# Patient Record
Sex: Male | Born: 1995 | Race: White | Hispanic: No | Marital: Single | Smoking: Current some day smoker
Health system: Southern US, Community
[De-identification: ages and names within clinical notes are randomized; demographics above are authoritative.]

## PROBLEM LIST (undated history)

## (undated) DIAGNOSIS — D496 Neoplasm of unspecified behavior of brain: Secondary | ICD-10-CM

## (undated) DIAGNOSIS — F3181 Bipolar II disorder: Secondary | ICD-10-CM

## (undated) DIAGNOSIS — F4010 Social phobia, unspecified: Secondary | ICD-10-CM

## (undated) HISTORY — PX: BRAIN TUMOR EXCISION: SHX577

---
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
## 2021-06-03 ENCOUNTER — Encounter (HOSPITAL_BASED_OUTPATIENT_CLINIC_OR_DEPARTMENT_OTHER): Payer: Self-pay

## 2021-06-03 ENCOUNTER — Emergency Department (HOSPITAL_BASED_OUTPATIENT_CLINIC_OR_DEPARTMENT_OTHER): Payer: 59

## 2021-06-03 ENCOUNTER — Other Ambulatory Visit: Payer: Self-pay

## 2021-06-03 ENCOUNTER — Emergency Department (HOSPITAL_BASED_OUTPATIENT_CLINIC_OR_DEPARTMENT_OTHER)
Admission: EM | Admit: 2021-06-03 | Discharge: 2021-06-04 | Disposition: A | Discharge status: Home / Self Care | Payer: 59 | Attending: Emergency Medicine | Admitting: Emergency Medicine

## 2021-06-03 DIAGNOSIS — G928 Other toxic encephalopathy: Secondary | ICD-10-CM

## 2021-06-03 DIAGNOSIS — S20212A Contusion of left front wall of thorax, initial encounter: Secondary | ICD-10-CM | POA: Insufficient documentation

## 2021-06-03 DIAGNOSIS — S299XXA Unspecified injury of thorax, initial encounter: Secondary | ICD-10-CM | POA: Diagnosis present

## 2021-06-03 DIAGNOSIS — F3130 Bipolar disorder, current episode depressed, mild or moderate severity, unspecified: Secondary | ICD-10-CM | POA: Insufficient documentation

## 2021-06-03 DIAGNOSIS — F17200 Nicotine dependence, unspecified, uncomplicated: Secondary | ICD-10-CM | POA: Diagnosis not present

## 2021-06-03 DIAGNOSIS — G9349 Other encephalopathy: Secondary | ICD-10-CM | POA: Insufficient documentation

## 2021-06-03 DIAGNOSIS — R400 Somnolence: Secondary | ICD-10-CM | POA: Insufficient documentation

## 2021-06-03 DIAGNOSIS — Y900 Blood alcohol level of less than 20 mg/100 ml: Secondary | ICD-10-CM | POA: Diagnosis not present

## 2021-06-03 DIAGNOSIS — Y92410 Unspecified street and highway as the place of occurrence of the external cause: Secondary | ICD-10-CM | POA: Diagnosis not present

## 2021-06-03 LAB — CBC WITH DIFFERENTIAL/PLATELET
Abs Immature Granulocytes: 0.04 10*3/uL (ref 0.00–0.07)
Basophils Absolute: 0 10*3/uL (ref 0.0–0.1)
Basophils Relative: 0 %
Eosinophils Absolute: 0.2 10*3/uL (ref 0.0–0.5)
Eosinophils Relative: 2 %
HCT: 44.8 % (ref 39.0–52.0)
Hemoglobin: 14.4 g/dL (ref 13.0–17.0)
Immature Granulocytes: 0 %
Lymphocytes Relative: 22 %
Lymphs Abs: 2 10*3/uL (ref 0.7–4.0)
MCH: 27 pg (ref 26.0–34.0)
MCHC: 32.1 g/dL (ref 30.0–36.0)
MCV: 83.9 fL (ref 80.0–100.0)
Monocytes Absolute: 1 10*3/uL (ref 0.1–1.0)
Monocytes Relative: 10 %
Neutro Abs: 6.1 10*3/uL (ref 1.7–7.7)
Neutrophils Relative %: 66 %
Platelets: 303 10*3/uL (ref 150–400)
RBC: 5.34 MIL/uL (ref 4.22–5.81)
RDW: 15.9 % — ABNORMAL HIGH (ref 11.5–15.5)
WBC: 9.4 10*3/uL (ref 4.0–10.5)
nRBC: 0 % (ref 0.0–0.2)

## 2021-06-03 LAB — URINALYSIS, ROUTINE W REFLEX MICROSCOPIC
Bilirubin Urine: NEGATIVE
Glucose, UA: NEGATIVE mg/dL
Hgb urine dipstick: NEGATIVE
Ketones, ur: NEGATIVE mg/dL
Leukocytes,Ua: NEGATIVE
Nitrite: NEGATIVE
Protein, ur: NEGATIVE mg/dL
Specific Gravity, Urine: 1.013 (ref 1.005–1.030)
pH: 6.5 (ref 5.0–8.0)
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
## 2021-06-04 LAB — COMPREHENSIVE METABOLIC PANEL
ALT: 35 U/L (ref 0–44)
AST: 37 U/L (ref 15–41)
Albumin: 3.6 g/dL (ref 3.5–5.0)
Alkaline Phosphatase: 65 U/L (ref 38–126)
Anion gap: 7 (ref 5–15)
BUN: 15 mg/dL (ref 6–20)
CO2: 31 mmol/L (ref 22–32)
Calcium: 8.5 mg/dL — ABNORMAL LOW (ref 8.9–10.3)
Chloride: 103 mmol/L (ref 98–111)
Creatinine, Ser: 1.24 mg/dL (ref 0.61–1.24)
GFR, Estimated: >60 mL/min (ref >60)
Glucose, Bld: 77 mg/dL (ref 70–99)
Potassium: 3.6 mmol/L (ref 3.5–5.1)
Sodium: 141 mmol/L (ref 135–145)
Total Bilirubin: 0.2 mg/dL — ABNORMAL LOW (ref 0.3–1.2)
Total Protein: 5.9 g/dL — ABNORMAL LOW (ref 6.5–8.1)

## 2021-06-04 LAB — ETHANOL: Alcohol, Ethyl (B): <10 mg/dL (ref <10)

## 2021-06-04 LAB — AMMONIA: Ammonia: 53 umol/L — ABNORMAL HIGH (ref 9–35)

## 2021-06-04 LAB — RAPID URINE DRUG SCREEN, HOSP PERFORMED
Amphetamines: NOT DETECTED
Barbiturates: NOT DETECTED
Benzodiazepines: NOT DETECTED
Cocaine: NOT DETECTED
Opiates: NOT DETECTED
Tetrahydrocannabinol: NOT DETECTED

## 2021-06-04 LAB — PROTIME-INR
INR: 0.9 (ref 0.8–1.2)
Prothrombin Time: 12.1 seconds (ref 11.4–15.2)

## 2021-06-04 MED ORDER — LACTULOSE 20 GM/30ML PO SOLN: 20.0000 g | Freq: Two times a day (BID) | ORAL | 0 refills | Status: DC

## 2021-06-04 NOTE — Discharge Instructions (Addendum)
You were seen today for altered mental status.  This is multifactorial likely.  However, you did have some elevated ammonia levels.  Take lactulose twice daily with the goal to increase stools and rid your body of ammonia.  It is very importantly follow-up with a primary physician.  Additionally, you should reestablish psychiatric care and evaluation given your ongoing bipolar disorder.

## 2021-12-19 IMAGING — CT CT HEAD W/O CM
4 of 5 series · 17 of 47 positions shown, 19 images · non-contrast
Comparison: MR head 03/15/2008

CLINICAL DATA: Mental status change.  Unknown cause.

EXAM:
CT HEAD WITHOUT CONTRAST
TECHNIQUE: Contiguous axial images were obtained from the base of the skull
through the vertex without intravenous contrast.

[Series 2: head wo · axial · 0.42mm/px · z∈[-123,-13]mm · 7 of 30 slices shown, 9 images]
[im 4/30  brain]
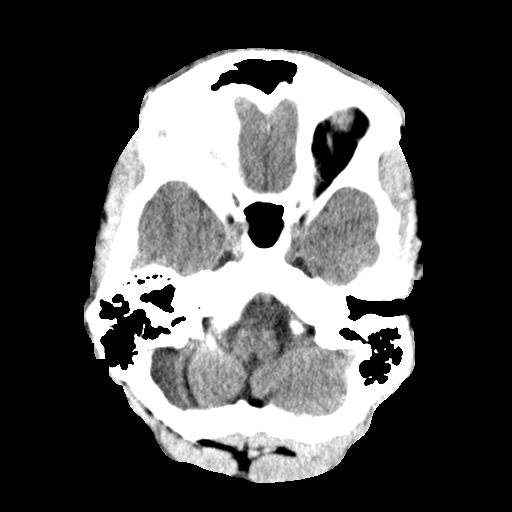
[im 4/30  bone]
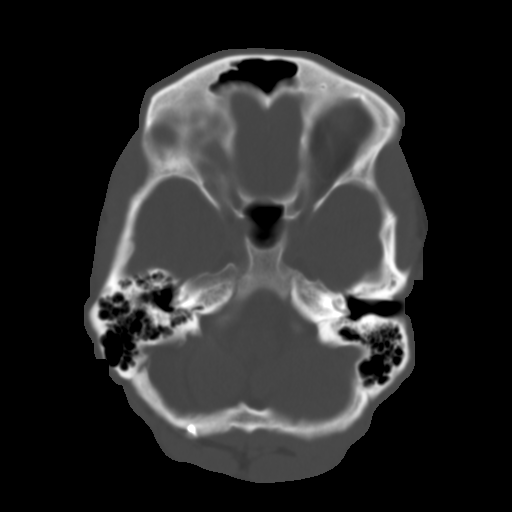
[im 8/30  brain]
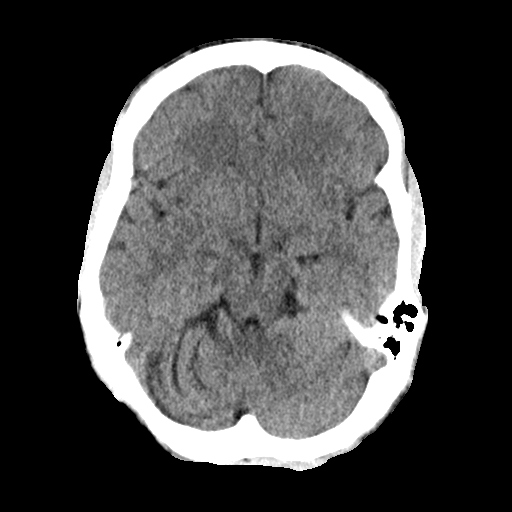
[im 11/30  brain]
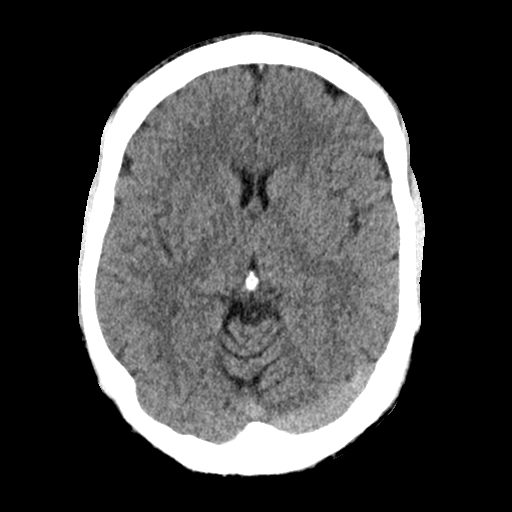
[im 15/30  brain]
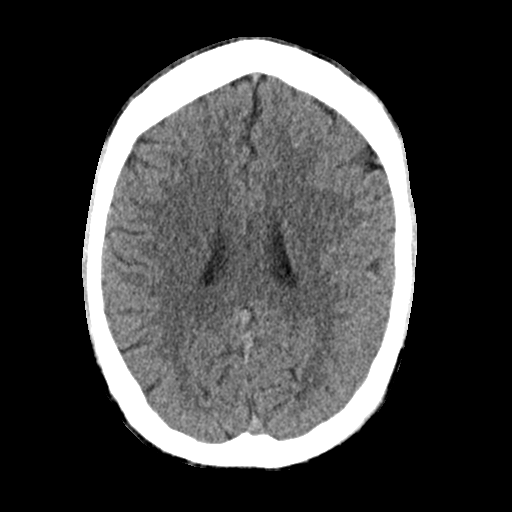
[im 19/30  brain]
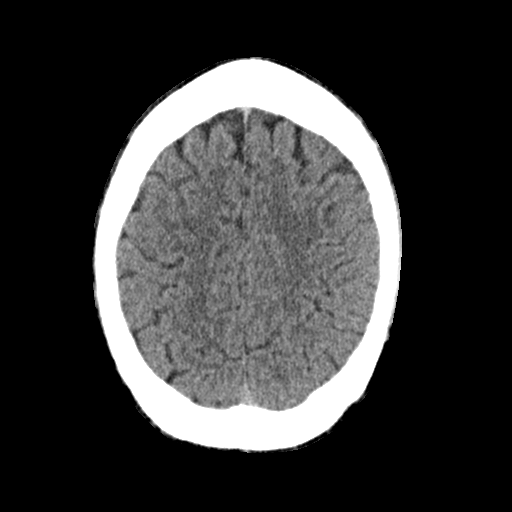
[im 19/30  bone]
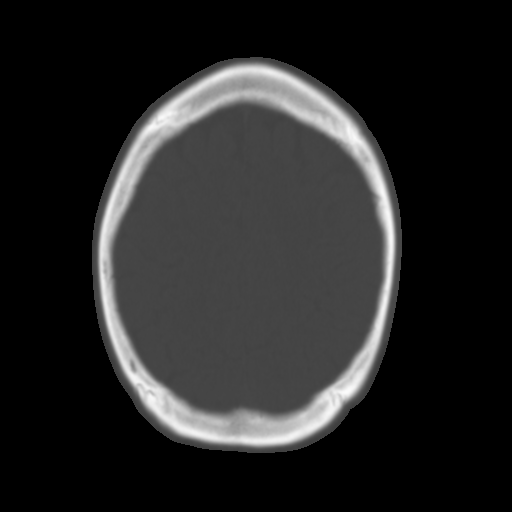
[im 22/30  brain]
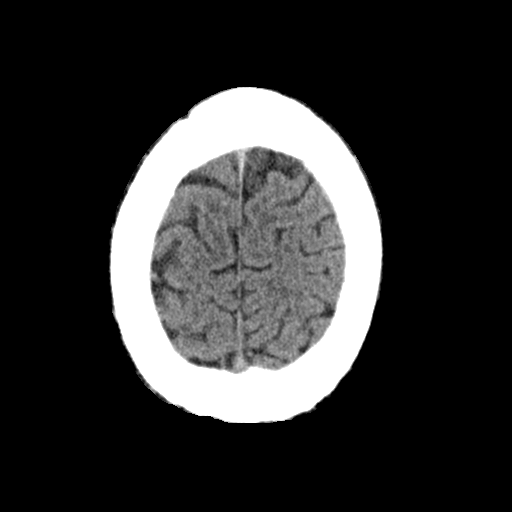
[im 26/30  brain]
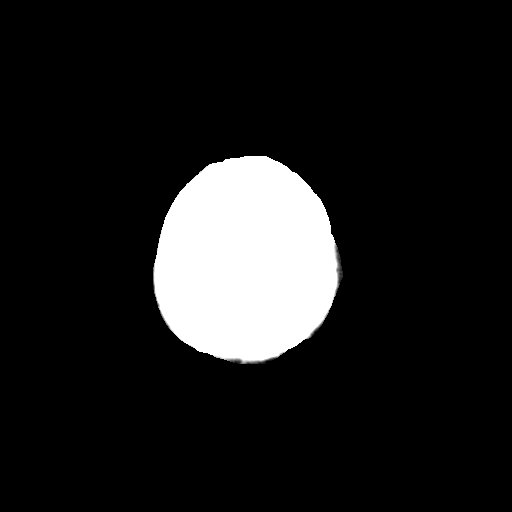

[Series 3: head bone · axial · 0.42mm/px · z∈[-124,-72]mm · 4 of 75 slices shown]
[im 8/75  bone]
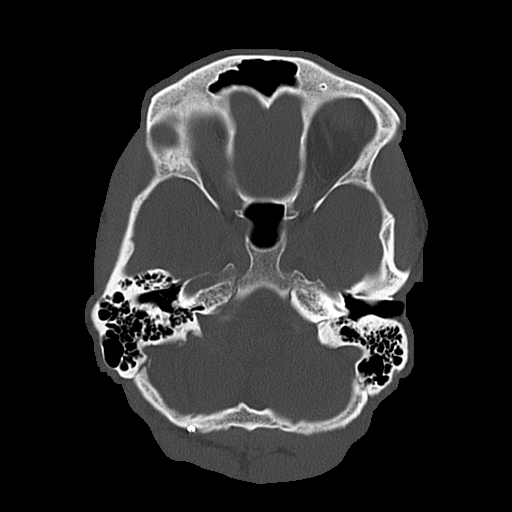
[im 15/75  bone]
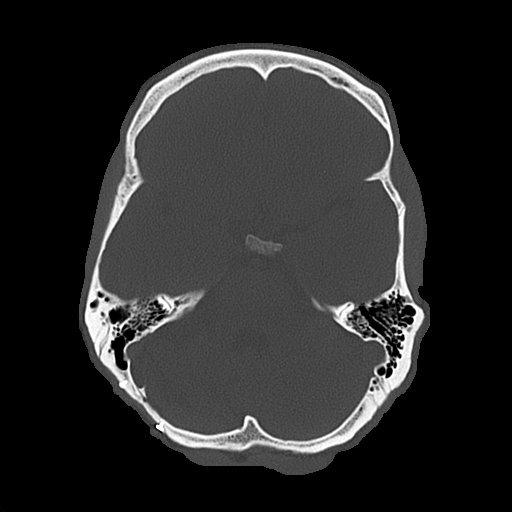
[im 23/75  bone]
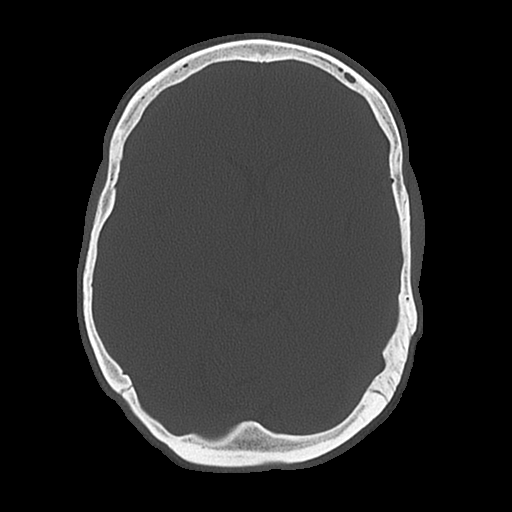
[im 34/75  bone]
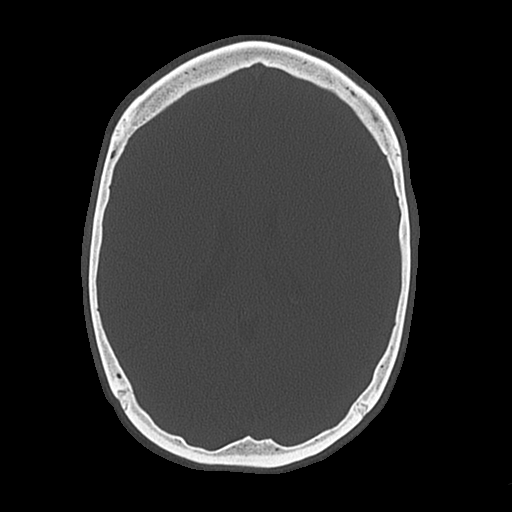

[Series 4: coronal soft · coronal · 0.33mm/px · 3 of 63 slices shown]
[im 16/63  brain]
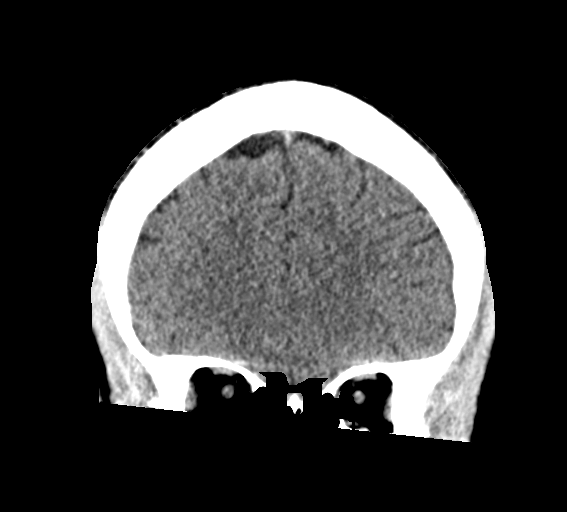
[im 32/63  brain]
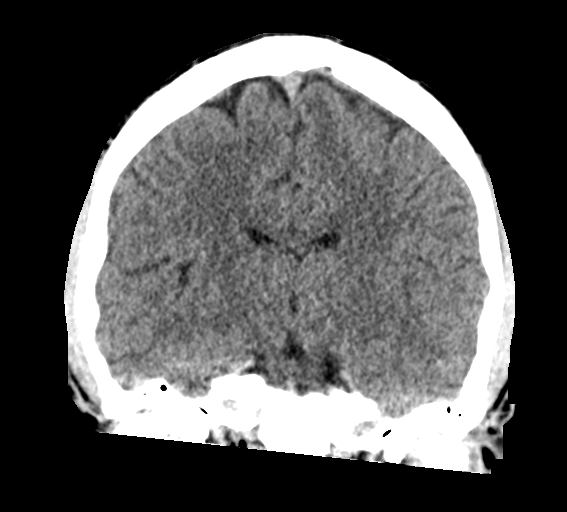
[im 47/63  brain]
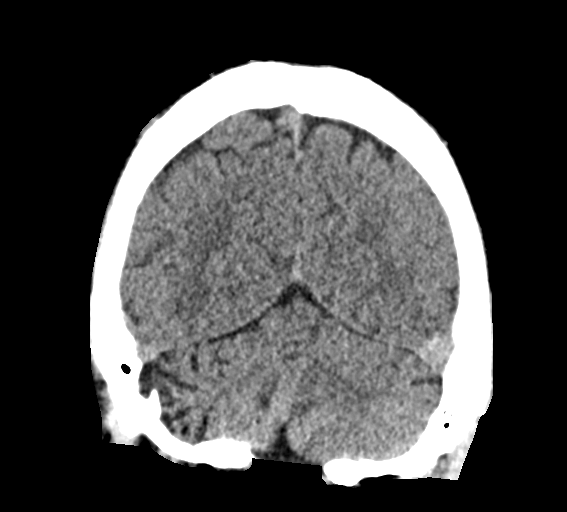

[Series 5: sagittal soft · sagittal · 0.33mm/px · 3 of 51 slices shown]
[im 17/51  brain]
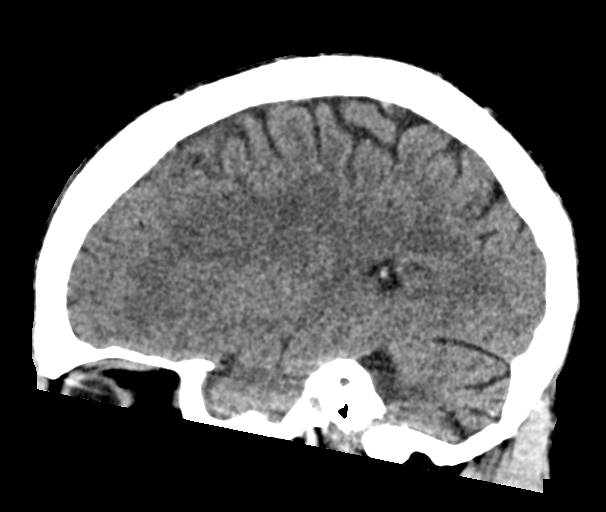
[im 26/51  brain]
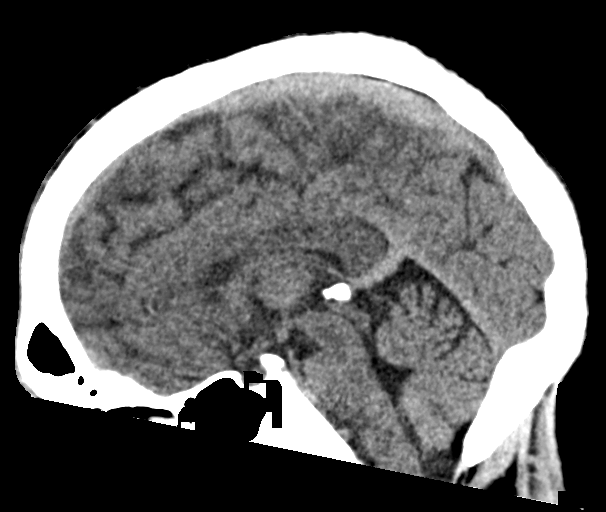
[im 34/51  brain]
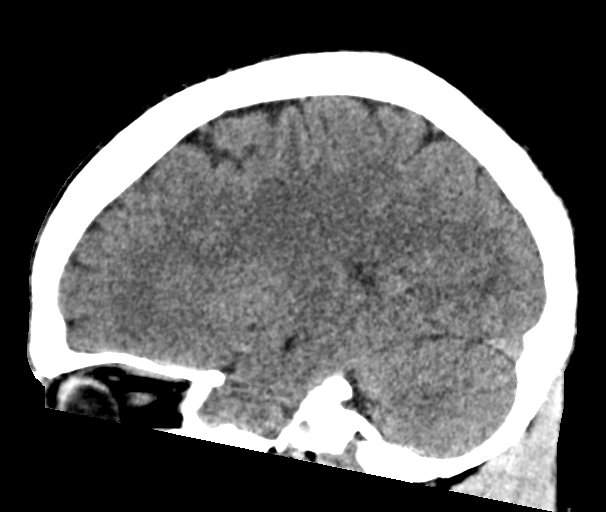

[17 of 47 positions shown; findings below may reference images not displayed]

FINDINGS: Brain:

Right cerebellar encephalomalacia consistent with history of postop
changes of a right cerebellar mass noted on MR head 03/15/2008. No
evidence of large-territorial acute infarction. No parenchymal
hemorrhage. No mass lesion. No extra-axial collection.

No mass effect or midline shift. No hydrocephalus. Basilar cisterns
are patent.

Vascular: No hyperdense vessel.

Skull: No acute fracture or focal lesion. Right occipital surgical
changes.

Sinuses/Orbits: Paranasal sinuses and mastoid air cells are clear.
The orbits are unremarkable.

Other: None.
IMPRESSION: No acute intracranial abnormality.

## 2023-01-27 DIAGNOSIS — C719 Malignant neoplasm of brain, unspecified: Secondary | ICD-10-CM | POA: Insufficient documentation

## 2023-04-30 ENCOUNTER — Emergency Department (HOSPITAL_COMMUNITY)
Admission: EM | Admit: 2023-04-30 | Discharge: 2023-04-30 | Discharge status: Against Medical Advice | Payer: Medicaid Other | Attending: Emergency Medicine | Admitting: Emergency Medicine

## 2023-04-30 ENCOUNTER — Encounter (HOSPITAL_COMMUNITY): Payer: Self-pay

## 2023-04-30 ENCOUNTER — Other Ambulatory Visit: Payer: Self-pay

## 2023-04-30 DIAGNOSIS — F1123 Opioid dependence with withdrawal: Secondary | ICD-10-CM | POA: Diagnosis present

## 2023-04-30 DIAGNOSIS — Z5321 Procedure and treatment not carried out due to patient leaving prior to being seen by health care provider: Secondary | ICD-10-CM | POA: Diagnosis not present

## 2023-04-30 NOTE — ED Triage Notes (Addendum)
Patient reports he takes opiates recreationally and has been without for a day. States he came here to get some more like morphine. Informed patient ER does not just give out narcotics for recreational use, states he would just take whatever we would give him.

## 2023-05-08 ENCOUNTER — Encounter (HOSPITAL_BASED_OUTPATIENT_CLINIC_OR_DEPARTMENT_OTHER): Payer: Self-pay | Admitting: Emergency Medicine

## 2023-05-08 ENCOUNTER — Emergency Department (HOSPITAL_BASED_OUTPATIENT_CLINIC_OR_DEPARTMENT_OTHER): Payer: Medicaid Other

## 2023-05-08 ENCOUNTER — Emergency Department (HOSPITAL_BASED_OUTPATIENT_CLINIC_OR_DEPARTMENT_OTHER)
Admission: EM | Admit: 2023-05-08 | Discharge: 2023-05-08 | Disposition: A | Discharge status: Home / Self Care | Payer: Medicaid Other | Attending: Emergency Medicine | Admitting: Emergency Medicine

## 2023-05-08 ENCOUNTER — Other Ambulatory Visit: Payer: Self-pay

## 2023-05-08 DIAGNOSIS — R2232 Localized swelling, mass and lump, left upper limb: Secondary | ICD-10-CM | POA: Diagnosis present

## 2023-05-08 DIAGNOSIS — I809 Phlebitis and thrombophlebitis of unspecified site: Secondary | ICD-10-CM | POA: Insufficient documentation

## 2023-05-08 LAB — COMPREHENSIVE METABOLIC PANEL
ALT: 27 U/L (ref 0–44)
AST: 22 U/L (ref 15–41)
Albumin: 4.4 g/dL (ref 3.5–5.0)
Alkaline Phosphatase: 120 U/L (ref 38–126)
Anion gap: 10 (ref 5–15)
BUN: 18 mg/dL (ref 6–20)
CO2: 28 mmol/L (ref 22–32)
Calcium: 9.3 mg/dL (ref 8.9–10.3)
Chloride: 104 mmol/L (ref 98–111)
Creatinine, Ser: 0.96 mg/dL (ref 0.61–1.24)
GFR, Estimated: >60 mL/min (ref >60)
Glucose, Bld: 102 mg/dL — ABNORMAL HIGH (ref 70–99)
Potassium: 3.7 mmol/L (ref 3.5–5.1)
Sodium: 142 mmol/L (ref 135–145)
Total Bilirubin: 0.5 mg/dL (ref 0.3–1.2)
Total Protein: 7.5 g/dL (ref 6.5–8.1)

## 2023-05-08 LAB — LACTIC ACID, PLASMA: Lactic Acid, Venous: 1.3 mmol/L (ref 0.5–1.9)

## 2023-05-08 LAB — CBC WITH DIFFERENTIAL/PLATELET
Abs Immature Granulocytes: 0.01 10*3/uL (ref 0.00–0.07)
Basophils Absolute: 0 10*3/uL (ref 0.0–0.1)
Basophils Relative: 0 %
Eosinophils Absolute: 0 10*3/uL (ref 0.0–0.5)
Eosinophils Relative: 0 %
HCT: 45.5 % (ref 39.0–52.0)
Hemoglobin: 15 g/dL (ref 13.0–17.0)
Immature Granulocytes: 0 %
Lymphocytes Relative: 27 %
Lymphs Abs: 1.6 10*3/uL (ref 0.7–4.0)
MCH: 25.5 pg — ABNORMAL LOW (ref 26.0–34.0)
MCHC: 33 g/dL (ref 30.0–36.0)
MCV: 77.2 fL — ABNORMAL LOW (ref 80.0–100.0)
Monocytes Absolute: 0.7 10*3/uL (ref 0.1–1.0)
Monocytes Relative: 11 %
Neutro Abs: 3.5 10*3/uL (ref 1.7–7.7)
Neutrophils Relative %: 62 %
Platelets: 267 10*3/uL (ref 150–400)
RBC: 5.89 MIL/uL — ABNORMAL HIGH (ref 4.22–5.81)
RDW: 16.8 % — ABNORMAL HIGH (ref 11.5–15.5)
WBC: 5.8 10*3/uL (ref 4.0–10.5)
nRBC: 0 % (ref 0.0–0.2)

## 2023-05-08 MED ORDER — SODIUM CHLORIDE 0.9 % IV SOLN
1.0000 g | Freq: Once | INTRAVENOUS | Status: AC
  Administered 2023-05-08: 1 g via INTRAVENOUS
  Filled 2023-05-08: qty 10

## 2023-05-08 MED ORDER — DOXYCYCLINE HYCLATE 100 MG PO TABS
100.0000 mg | ORAL_TABLET | Freq: Once | ORAL | Status: AC
  Administered 2023-05-08: 100 mg via ORAL
  Filled 2023-05-08: qty 1

## 2023-05-08 MED ORDER — IBUPROFEN 600 MG PO TABS: 600.0000 mg | ORAL_TABLET | Freq: Four times a day (QID) | ORAL | 0 refills | Status: DC | PRN

## 2023-05-08 MED ORDER — DOXYCYCLINE HYCLATE 100 MG PO CAPS: 100.0000 mg | ORAL_CAPSULE | Freq: Two times a day (BID) | ORAL | 0 refills | Status: DC

## 2023-05-08 MED ORDER — KETOROLAC TROMETHAMINE 15 MG/ML IJ SOLN
15.0000 mg | Freq: Once | INTRAMUSCULAR | Status: AC
  Administered 2023-05-08: 15 mg via INTRAVENOUS
  Filled 2023-05-08: qty 1

## 2023-05-08 NOTE — Discharge Instructions (Addendum)
Please make sure you follow-up with a primary care doctor, call the number below, if you do not have a primary care doctor.  You have a superficial blood clot in your arm, however it is not deep, thus you can use warm compresses, and ibuprofen to help with this.  If the area starts streaking, going up your arm, or there is worsening swelling, or you have shortness of breath please return to the ER.  I put you on a low-dose of antibiotic, given your IV drug use, take this and completion, to help prevent infection.

## 2023-05-08 NOTE — ED Provider Notes (Signed)
[place] EMERGENCY DEPARTMENT AT [facility] Provider Note   CSN: [id number] Arrival date & time: 05/08/23  1520     History  Chief Complaint  Patient presents with   Arm Pain    Brad Evans is a 27 y.o. male, history of IV drug use, who presents to the ED secondary to left arm swelling, pain that occurred after he shot up in his left arm today.  He states he shot up in his left arm, and withdrew the needle, and noticed that he had some pain in the area.  States that it became more swollen, and red streaking up his arm, but the redness has now improved.  This all occurred this morning.  He endorses feeling rundown, but denies any fevers.  Has not had any chest pain or shortness of breath.  Has not taken anything for the pain.  Needle did not break off for patient.  Home Medications Prior to Admission medications   Medication Sig Start Date End Date Taking? Authorizing Provider  doxycycline (VIBRAMYCIN) 100 MG capsule Take 1 capsule (100 mg total) by mouth 2 (two) times daily. 05/08/23  Yes Aina Rossbach L, PA  ibuprofen (ADVIL) 600 MG tablet Take 1 tablet (600 mg total) by mouth every 6 (six) hours as needed. 05/08/23  Yes Jakwon Gayton L, PA  Lactulose 20 GM/30ML SOLN Take 30 mLs (20 g total) by mouth 2 (two) times daily. 06/04/21   Horton, Mayer Masker, MD  sertraline (ZOLOFT) 50 MG tablet Take 50 mg by mouth every morning. 11/24/14   [provider]      Allergies    Patient has no known allergies.    Review of Systems   Review of Systems  Constitutional:  Negative for fever.  Skin:  Positive for color change.    Physical Exam Updated Vital Signs BP (!) 159/98   Pulse 98   Temp (!) 97.4 F (36.3 C) (Oral)   Resp 17   SpO2 100%  Physical Exam Vitals and nursing note reviewed.  Constitutional:      General: He is not in acute distress.    Appearance: He is well-developed.  HENT:     Head: Normocephalic and atraumatic.  Eyes:      Conjunctiva/sclera: Conjunctivae normal.  Cardiovascular:     Rate and Rhythm: Normal rate and regular rhythm.     Heart sounds: No murmur heard. Pulmonary:     Effort: Pulmonary effort is normal. No respiratory distress.     Breath sounds: Normal breath sounds.  Abdominal:     Palpations: Abdomen is soft.     Tenderness: There is no abdominal tenderness.  Musculoskeletal:        General: No swelling.     Cervical back: Neck supple.     Comments: TTP of left mid forearm, worse with flexion/extension, palpation  Skin:    General: Skin is warm and dry.     Capillary Refill: Capillary refill takes less than 2 seconds.     Comments: +erythematous nodule to anterior mid L forearm   Neurological:     Mental Status: He is alert.  Psychiatric:        Mood and Affect: Mood normal.     ED Results / Procedures / Treatments   Labs (all labs ordered are listed, but only abnormal results are displayed) Labs Reviewed  COMPREHENSIVE METABOLIC PANEL - Abnormal; Notable for the following components:      Result Value   Glucose, Bld  102 (*)    All other components within normal limits  CBC WITH DIFFERENTIAL/PLATELET - Abnormal; Notable for the following components:   RBC 5.89 (*)    MCV 77.2 (*)    MCH 25.5 (*)    RDW 16.8 (*)    All other components within normal limits  CULTURE, BLOOD (ROUTINE X 2)  CULTURE, BLOOD (ROUTINE X 2)  LACTIC ACID, PLASMA    EKG None  Radiology US Venous Img Upper Uni Left  Result Date: 05/08/2023 CLINICAL DATA:  Left forearm pain and swelling, history of IV drug use EXAM: LEFT UPPER EXTREMITY VENOUS DOPPLER ULTRASOUND TECHNIQUE: Gray-scale sonography with graded compression, as well as color Doppler and duplex ultrasound were performed to evaluate the upper extremity deep venous system from the level of the subclavian vein and including the jugular, axillary, basilic, radial, ulnar and upper cephalic vein. Spectral Doppler was utilized to evaluate flow at  rest and with distal augmentation maneuvers. COMPARISON:  None Available. FINDINGS: Contralateral Subclavian Vein: Respiratory phasicity is normal and symmetric with the symptomatic side. No evidence of thrombus. Normal compressibility. Internal Jugular Vein: No evidence of thrombus. Normal compressibility, respiratory phasicity and response to augmentation. Subclavian Vein: No evidence of thrombus. Normal compressibility, respiratory phasicity and response to augmentation. Axillary Vein: No evidence of thrombus. Normal compressibility, respiratory phasicity and response to augmentation. Cephalic Vein: No evidence of thrombus. Normal compressibility, respiratory phasicity and response to augmentation. Basilic Vein: No evidence of thrombus. Normal compressibility, respiratory phasicity and response to augmentation. Brachial Veins: No evidence of thrombus. Normal compressibility, respiratory phasicity and response to augmentation. Radial Veins: No evidence of thrombus. Normal compressibility, respiratory phasicity and response to augmentation. Ulnar Veins: No evidence of thrombus. Normal compressibility, respiratory phasicity and response to augmentation. Venous Reflux:  None visualized. Other Findings: Decreased compressibility is noted in the median cubital vein with echogenic material within consistent with nonocclusive thrombus. IMPRESSION: Nonocclusive thrombus is noted in the left median cubital vein. No other thrombus is seen. Electronically Signed   By: Alcide Clever M.D.   On: 05/08/2023 19:43   DG Humerus Left  Result Date: 05/08/2023 CLINICAL DATA:  Redness and inflammation of the left arm. IV drug use. EXAM: LEFT FOREARM - 2 VIEW; LEFT HUMERUS - 2+ VIEW COMPARISON:  None Available. FINDINGS: There is no evidence of fracture or other focal bone lesions in the left humerus or forearm. Soft tissues are unremarkable. IMPRESSION: Negative radiographs of the left humerus and forearm. Electronically Signed    By: Emmaline Kluver M.D.   On: 05/08/2023 17:17   DG Forearm Left  Result Date: 05/08/2023 CLINICAL DATA:  Redness and inflammation of the left arm. IV drug use. EXAM: LEFT FOREARM - 2 VIEW; LEFT HUMERUS - 2+ VIEW COMPARISON:  None Available. FINDINGS: There is no evidence of fracture or other focal bone lesions in the left humerus or forearm. Soft tissues are unremarkable. IMPRESSION: Negative radiographs of the left humerus and forearm. Electronically Signed   By: Emmaline Kluver M.D.   On: 05/08/2023 17:17    Procedures Procedures    Medications Ordered in ED Medications  ketorolac (TORADOL) 15 MG/ML injection 15 mg (15 mg Intravenous Given 05/08/23 1655)  cefTRIAXone (ROCEPHIN) 1 g in sodium chloride 0.9 % 100 mL IVPB (0 g Intravenous Stopped 05/08/23 1730)  doxycycline (VIBRA-TABS) tablet 100 mg (100 mg Oral Given 05/08/23 1654)    ED Course/ Medical Decision Making/ A&P  Medical Decision Making Patient is a 27 year old male, history of IV drug using, who presents to the ED secondary to redness, swelling to his left arm.  He states he shot up in this arm today, and then he developed a red nodule, and streaking up his arm.  The redness has improved however.  Denies any kind of fevers or chills.  We will obtain a ultrasound of his arm, to for further evaluation to evaluate for any chronic thrombus, as well as blood work, and lactic acid and blood cultures given his use of IV drugs wheezing.  He denies any chest pain or shortness of breath.  Amount and/or Complexity of Data Reviewed Labs: ordered.    Details: No acute findings on blood work Radiology: ordered.    Details: Humerus and radius ulna, unremarkable no findings of any kind of metallic objects or needles.  Nonocclusive thrombus in the left median cubital vein relatively. Discussion of management or test interpretation with external provider(s): Discussed with patient, he has a superficial  thrombophlebitis, blood.  Given his IVDU, we will start him on antibiotics for possible overlying cellulitis.  I discussed using ibuprofen, Tylenol, and warm compresses for the superficial thrombophlebitis, need for follow-up with PCP for further evaluation.  He voiced understanding discharged home.  Risk Prescription drug management.    Final Clinical Impression(s) / ED Diagnoses Final diagnoses:  Thrombophlebitis    Rx / DC Orders ED Discharge Orders          Ordered    doxycycline (VIBRAMYCIN) 100 MG capsule  2 times daily        05/08/23 2003    ibuprofen (ADVIL) 600 MG tablet  Every 6 hours PRN        05/08/23 2003              Pete Pelt, [place] 05/08/23 2305    Benjiman Core, MD 05/08/23 2328

## 2023-05-08 NOTE — ED Triage Notes (Signed)
Correction: pt thinks he hit the wrong spot when injecting.

## 2023-05-08 NOTE — ED Triage Notes (Signed)
Pt admits to being IV drug user and states his left arm from wrist to elbow was red and inflamed earlier, thinks he may have used bad needle. Chills. Feel run down

## 2023-05-13 LAB — CULTURE, BLOOD (ROUTINE X 2)
Culture: NO GROWTH
Special Requests: ADEQUATE

## 2023-06-28 ENCOUNTER — Other Ambulatory Visit: Payer: Self-pay

## 2023-06-28 ENCOUNTER — Encounter (HOSPITAL_BASED_OUTPATIENT_CLINIC_OR_DEPARTMENT_OTHER): Payer: Self-pay

## 2023-06-28 DIAGNOSIS — M79605 Pain in left leg: Secondary | ICD-10-CM | POA: Diagnosis not present

## 2023-06-28 DIAGNOSIS — M79643 Pain in unspecified hand: Secondary | ICD-10-CM | POA: Insufficient documentation

## 2023-06-28 DIAGNOSIS — M791 Myalgia, unspecified site: Secondary | ICD-10-CM | POA: Insufficient documentation

## 2023-06-28 DIAGNOSIS — M79604 Pain in right leg: Secondary | ICD-10-CM | POA: Diagnosis not present

## 2023-06-28 LAB — CBC
HCT: 42 % (ref 39.0–52.0)
Hemoglobin: 14.2 g/dL (ref 13.0–17.0)
MCH: 26.1 pg (ref 26.0–34.0)
MCHC: 33.8 g/dL (ref 30.0–36.0)
MCV: 77.2 fL — ABNORMAL LOW (ref 80.0–100.0)
Platelets: 341 10*3/uL (ref 150–400)
RBC: 5.44 MIL/uL (ref 4.22–5.81)
RDW: 14.3 % (ref 11.5–15.5)
WBC: 5.3 10*3/uL (ref 4.0–10.5)
nRBC: 0 % (ref 0.0–0.2)

## 2023-06-28 LAB — CK: Total CK: 333 U/L (ref 49–397)

## 2023-06-28 LAB — BASIC METABOLIC PANEL
Anion gap: 9 (ref 5–15)
BUN: 21 mg/dL — ABNORMAL HIGH (ref 6–20)
CO2: 28 mmol/L (ref 22–32)
Calcium: 9.8 mg/dL (ref 8.9–10.3)
Chloride: 106 mmol/L (ref 98–111)
Creatinine, Ser: 1.18 mg/dL (ref 0.61–1.24)
GFR, Estimated: >60 mL/min (ref >60)
Glucose, Bld: 93 mg/dL (ref 70–99)
Potassium: 3.9 mmol/L (ref 3.5–5.1)
Sodium: 143 mmol/L (ref 135–145)

## 2023-06-28 NOTE — ED Triage Notes (Signed)
Pt reports bilateral hand cramps since 1200 today with some generalized muscle cramps in legs. Pt working in [employer] and advised it been ongoing for the past couple of weeks.  GCS 15. A/ox4

## 2023-06-29 ENCOUNTER — Emergency Department (HOSPITAL_BASED_OUTPATIENT_CLINIC_OR_DEPARTMENT_OTHER)
Admission: EM | Admit: 2023-06-29 | Discharge: 2023-06-29 | Disposition: A | Discharge status: Home / Self Care | Payer: MEDICAID | Attending: Emergency Medicine | Admitting: Emergency Medicine

## 2023-06-29 DIAGNOSIS — R252 Cramp and spasm: Secondary | ICD-10-CM

## 2023-06-29 MED ORDER — IBUPROFEN 400 MG PO TABS
600.0000 mg | ORAL_TABLET | Freq: Once | ORAL | Status: AC
  Administered 2023-06-29: 600 mg via ORAL
  Filled 2023-06-29: qty 1

## 2023-06-29 NOTE — ED Provider Notes (Signed)
[facility] EMERG[facility]Y DE[facility]MENT AT [facility]  Provider Note  CSN: [id number] Arrival date & time: 06/28/23 2132  History Chief Complaint  Patient presents with   Hand Pain    Brad Evans is a 27 y.o. 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 He reports he has not been out in the heat much and has been trying to stay hydrated. He left work around 4pm today, cramping has improved since then. Some cramping in legs also but mostly it is in his hands. He denies any fever, N/V/D. He is 'worried about his health' but cannot specify any specific concern he has otherwise.    Home Medications Prior to Admission medications   Medication Sig Start Date End Date Taking? Authorizing Provider  doxycycline (VIBRAMYCIN) 100 MG capsule Take 1 capsule (100 mg total) by mouth 2 (two) times daily. 05/08/23   Small, Brooke L, PA  ibuprofen (ADVIL) 600 MG tablet Take 1 tablet (600 mg total) by mouth every 6 (six) hours as needed. 05/08/23   Small, Brooke L, PA  Lactulose 20 GM/30ML SOLN Take 30 mLs (20 g total) by mouth 2 (two) times daily. 06/04/21   Horton, Mayer Masker, MD  sertraline (ZOLOFT) 50 MG tablet Take 50 mg by mouth every morning. 11/24/14   [provider]     Allergies    Patient has no known allergies.   Review of Systems   Review of Systems Please see HPI for pertinent positives and negatives  Physical Exam BP (!) 155/89 (BP Location: Right Arm)   Pulse (!) 101   Temp 98.5 F (36.9 C) (Oral)   Resp 14   Ht 6' (1.829 m)   Wt 68 kg   SpO2 100%   BMI 20.34 kg/m   Physical Exam Vitals and nursing note reviewed.  Constitutional:      Appearance: Normal appearance.  HENT:     Head: Normocephalic and atraumatic.     Nose: Nose normal.     Mouth/Throat:     Mouth: Mucous membranes are moist.  Eyes:     Extraocular Movements: Extraocular movements intact.     Conjunctiva/sclera: Conjunctivae normal.   Cardiovascular:     Rate and Rhythm: Normal rate.     Pulses: Normal pulses.  Pulmonary:     Effort: Pulmonary effort is normal.     Breath sounds: Normal breath sounds.  Abdominal:     General: Abdomen is flat.     Palpations: Abdomen is soft.     Tenderness: There is no abdominal tenderness.  Musculoskeletal:        General: No swelling, tenderness or deformity. Normal range of motion.     Cervical back: Neck supple.  Skin:    General: Skin is warm and dry.  Neurological:     General: No focal deficit present.     Mental Status: He is alert.  Psychiatric:        Mood and Affect: Mood normal.     ED Results / Procedures / Treatments   EKG None  Procedures Procedures  Medications Ordered in the ED Medications  ibuprofen (ADVIL) tablet 600 mg (has no administration in time range)    Initial Impression and Plan  Patient here with nonspecific MSK pain and muscle cramping at work several hours ago. Exam is benign, vitals are reassuring. Labs done in triage show normal CBC, BMP and CK. Patient given Motrin for  pain. Reassured no apparently emergent cause of his cramping was found. Recommend he rest and stay cool for the next few days. PCP follow up, RTED for any other concerns.    ED Course       MDM Rules/Calculators/A&P Medical Decision Making Problems Addressed: Hand cramps: acute illness or injury  Amount and/or Complexity of Data Reviewed Labs: ordered. Decision-making details documented in ED Course.  Risk OTC drugs.     Final Clinical Impression(s) / ED Diagnoses Final diagnoses:  Hand cramps    Rx / DC Orders ED Discharge Orders     None        Pollyann Savoy, MD 06/29/23 0225

## 2023-09-13 ENCOUNTER — Emergency Department (HOSPITAL_BASED_OUTPATIENT_CLINIC_OR_DEPARTMENT_OTHER)
Admission: EM | Admit: 2023-09-13 | Discharge: 2023-09-13 | Disposition: A | Discharge status: Home / Self Care | Payer: MEDICAID | Attending: Emergency Medicine | Admitting: Emergency Medicine

## 2023-09-13 ENCOUNTER — Encounter (HOSPITAL_BASED_OUTPATIENT_CLINIC_OR_DEPARTMENT_OTHER): Payer: Self-pay

## 2023-09-13 ENCOUNTER — Emergency Department (HOSPITAL_BASED_OUTPATIENT_CLINIC_OR_DEPARTMENT_OTHER): Payer: MEDICAID

## 2023-09-13 ENCOUNTER — Other Ambulatory Visit: Payer: Self-pay

## 2023-09-13 DIAGNOSIS — Y92480 Sidewalk as the place of occurrence of the external cause: Secondary | ICD-10-CM | POA: Diagnosis not present

## 2023-09-13 DIAGNOSIS — W01198A Fall on same level from slipping, tripping and stumbling with subsequent striking against other object, initial encounter: Secondary | ICD-10-CM | POA: Diagnosis not present

## 2023-09-13 DIAGNOSIS — S066X1A Traumatic subarachnoid hemorrhage with loss of consciousness of 30 minutes or less, initial encounter: Secondary | ICD-10-CM | POA: Insufficient documentation

## 2023-09-13 DIAGNOSIS — S0101XA Laceration without foreign body of scalp, initial encounter: Secondary | ICD-10-CM | POA: Insufficient documentation

## 2023-09-13 MED ORDER — LIDOCAINE-EPINEPHRINE-TETRACAINE (LET) TOPICAL GEL
3.0000 mL | Freq: Once | TOPICAL | Status: AC
  Administered 2023-09-13: 3 mL via TOPICAL
  Filled 2023-09-13: qty 3

## 2023-09-13 MED ORDER — LIDOCAINE-EPINEPHRINE (PF) 2 %-1:200000 IJ SOLN
10.0000 mL | Freq: Once | INTRAMUSCULAR | Status: AC
  Administered 2023-09-13: 10 mL
  Filled 2023-09-13: qty 20

## 2023-09-13 NOTE — Discharge Instructions (Addendum)
Return to the ER for severe or concerning symptoms. Follow up with neurosurgery for persistent headaches or other concerns. Can also follow up with your neurology team at [facility] if needed.  Recommend staple removal in 10 days. This can be with your primary care provider, urgent care, or the ER.

## 2023-09-13 NOTE — ED Provider Notes (Signed)
[place] EMERGENCY DEPARTMENT AT [facility] Provider Note   CSN: [id number] Arrival date & time: 09/13/23  1248     History  Chief Complaint  Patient presents with   Brad Evans is a 27 y.o. male.  27 year old male presents for evaluation after a fall today.  Patient states that about 2 hours prior to my evaluation he fell backwards and hit the back of his head on the curb.  He is unsure if he passed out, states that he felt weird for a brief moment after he fell.  Continues to complain of pain in the left posterior head where he has a scalp laceration.  He is not anticoagulated.  Past medical history of brain tumor removed at 27 years old at [place].  No history of seizures.  Does have past medical history of bipolar disorder, social anxiety disorder, brain tumor.       Home Medications Prior to Admission medications   Medication Sig Start Date End Date Taking? Authorizing Provider  doxycycline (VIBRAMYCIN) 100 MG capsule Take 1 capsule (100 mg total) by mouth 2 (two) times daily. 05/08/23   Small, Brooke L, PA  ibuprofen (ADVIL) 600 MG tablet Take 1 tablet (600 mg total) by mouth every 6 (six) hours as needed. 05/08/23   Small, Brooke L, PA  Lactulose 20 GM/30ML SOLN Take 30 mLs (20 g total) by mouth 2 (two) times daily. 06/04/21   Horton, Mayer Masker, MD  sertraline (ZOLOFT) 50 MG tablet Take 50 mg by mouth every morning. 11/24/14   [provider]      Allergies    Patient has no known allergies.    Review of Systems   Review of Systems Negative except as per HPI Physical Exam Updated Vital Signs BP (!) 148/97   Pulse 90   Temp 98.2 F (36.8 C)   Resp 18   SpO2 100%  Physical Exam Vitals and nursing note reviewed.  Constitutional:      General: He is not in acute distress.    Appearance: He is well-developed. He is not diaphoretic.    HENT:     Head: Normocephalic.  Pulmonary:     Effort: Pulmonary effort is normal.   Musculoskeletal:     Cervical back: Neck supple. No tenderness.  Skin:    General: Skin is warm and dry.     Findings: No erythema or rash.  Neurological:     Mental Status: He is alert and oriented to person, place, and time.  Psychiatric:        Behavior: Behavior normal.     ED Results / Procedures / Treatments   Labs (all labs ordered are listed, but only abnormal results are displayed) Labs Reviewed - No data to display  EKG None  Radiology CT Head Wo Contrast  Result Date: 09/13/2023 CLINICAL DATA:  Polytrauma, blunt EXAM: CT HEAD WITHOUT CONTRAST CT CERVICAL SPINE WITHOUT CONTRAST TECHNIQUE: Multidetector CT imaging of the head and cervical spine was performed following the standard protocol without intravenous contrast. Multiplanar CT image reconstructions of the cervical spine were also generated. RADIATION DOSE REDUCTION: This exam was performed according to the departmental dose-optimization program which includes automated exposure control, adjustment of the mA and/or kV according to patient size and/or use of iterative reconstruction technique. COMPARISON:  CT Head 06/03/21 FINDINGS: CT HEAD FINDINGS Brain: Postsurgical changes from a right occipital craniotomy with encephalomalacia in the right cerebellar hemisphere, favored to be postsurgical. No hydrocephalus.  There is linear subarachnoid hemorrhage along the right frontal convexity (series 2, image 22). There is also a nonspecific hyperdense lesion along the left globus pallidus (series 2, image 14), which is likely present on CT head dated 06/03/2021. This is nonspecific. No CT evidence of acute cortical infarct. Vascular: No hyperdense vessel or unexpected calcification. Skull: Postsurgical changes from right occipital craniotomy. Sinuses/Orbits: No middle ear or mastoid effusion. Paranasal sinuses are notable for mild mucosal thickening in the right maxillary sinus. Orbits are unremarkable. Other: None. CT CERVICAL SPINE  FINDINGS Alignment: Normal. Skull base and vertebrae: No acute fracture. No primary bone lesion or focal pathologic process. Soft tissues and spinal canal: No prevertebral fluid or swelling. No visible canal hematoma. Disc levels:  No evidence of high-grade spinal canal stenosis. Upper chest: Negative. Other: None IMPRESSION: 1. Linear subarachnoid hemorrhage along the right frontal convexity. 2. Nonspecific hyperdense lesion along the left globus pallidus, which was likely present on CT head dated 06/03/2021. This is nonspecific and may represent a cavernous malformation. Recommend further evaluation with MRI of the brain with and without contrast. 3. Postsurgical changes from a right occipital craniotomy with encephalomalacia in the right cerebellar hemisphere, favored to be postsurgical. 4. No acute cervical spine fracture. Electronically Signed   By: Lorenza Cambridge M.D.   On: 09/13/2023 13:54   CT Cervical Spine Wo Contrast  Result Date: 09/13/2023 CLINICAL DATA:  Polytrauma, blunt EXAM: CT HEAD WITHOUT CONTRAST CT CERVICAL SPINE WITHOUT CONTRAST TECHNIQUE: Multidetector CT imaging of the head and cervical spine was performed following the standard protocol without intravenous contrast. Multiplanar CT image reconstructions of the cervical spine were also generated. RADIATION DOSE REDUCTION: This exam was performed according to the departmental dose-optimization program which includes automated exposure control, adjustment of the mA and/or kV according to patient size and/or use of iterative reconstruction technique. COMPARISON:  CT Head 06/03/21 FINDINGS: CT HEAD FINDINGS Brain: Postsurgical changes from a right occipital craniotomy with encephalomalacia in the right cerebellar hemisphere, favored to be postsurgical. No hydrocephalus. There is linear subarachnoid hemorrhage along the right frontal convexity (series 2, image 22). There is also a nonspecific hyperdense lesion along the left globus pallidus  (series 2, image 14), which is likely present on CT head dated 06/03/2021. This is nonspecific. No CT evidence of acute cortical infarct. Vascular: No hyperdense vessel or unexpected calcification. Skull: Postsurgical changes from right occipital craniotomy. Sinuses/Orbits: No middle ear or mastoid effusion. Paranasal sinuses are notable for mild mucosal thickening in the right maxillary sinus. Orbits are unremarkable. Other: None. CT CERVICAL SPINE FINDINGS Alignment: Normal. Skull base and vertebrae: No acute fracture. No primary bone lesion or focal pathologic process. Soft tissues and spinal canal: No prevertebral fluid or swelling. No visible canal hematoma. Disc levels:  No evidence of high-grade spinal canal stenosis. Upper chest: Negative. Other: None IMPRESSION: 1. Linear subarachnoid hemorrhage along the right frontal convexity. 2. Nonspecific hyperdense lesion along the left globus pallidus, which was likely present on CT head dated 06/03/2021. This is nonspecific and may represent a cavernous malformation. Recommend further evaluation with MRI of the brain with and without contrast. 3. Postsurgical changes from a right occipital craniotomy with encephalomalacia in the right cerebellar hemisphere, favored to be postsurgical. 4. No acute cervical spine fracture. Electronically Signed   By: Lorenza Cambridge M.D.   On: 09/13/2023 13:54    Procedures .Marland KitchenLaceration Repair  Date/Time: 09/13/2023 4:00 PM  Performed by: Jeannie Fend, PA-C Authorized by: Jeannie Fend, PA-C  Consent:    Consent obtained:  Verbal   Consent given by:  Patient   Risks discussed:  Infection, need for additional repair, pain, poor cosmetic result, nerve damage, poor wound healing and retained foreign body   Alternatives discussed:  No treatment Universal protocol:    Patient identity confirmed:  Verbally with patient Anesthesia:    Anesthesia method:  Topical application and local infiltration   Topical anesthetic:   LET   Local anesthetic:  Lidocaine 2% WITH epi Laceration details:    Location:  Scalp   Scalp location:  L parietal   Length (cm):  6   Depth (mm):  5 Pre-procedure details:    Preparation:  Patient was prepped and draped in usual sterile fashion and imaging obtained to evaluate for foreign bodies Exploration:    Imaging obtained comment:  CT   Imaging outcome: foreign body not noted     Wound exploration: wound explored through full range of motion and entire depth of wound visualized     Wound extent: no foreign body and no underlying fracture   Treatment:    Area cleansed with:  Saline   Amount of cleaning:  Extensive   Irrigation solution:  Sterile saline   Debridement:  None   Undermining:  None Skin repair:    Repair method:  Staples   Number of staples:  6 Approximation:    Approximation:  Close Repair type:    Repair type:  Simple Post-procedure details:    Dressing:  Open (no dressing)   Procedure completion:  Tolerated     Medications Ordered in ED Medications  lidocaine-EPINEPHrine-tetracaine (LET) topical gel (3 mLs Topical Given 09/13/23 1453)  lidocaine-EPINEPHrine (XYLOCAINE W/EPI) 2 %-1:200000 (PF) injection 10 mL (10 mLs Infiltration Given by Other 09/13/23 1453)    ED Course/ Medical Decision Making/ A&P                                 Medical Decision Making Amount and/or Complexity of Data Reviewed Radiology: ordered.   This patient presents to the ED for concern of scalp laceration/fall, this involves an extensive number of treatment options, and is a complaint that carries with it a high risk of complications and morbidity.  The differential diagnosis includes but not limited to skull fracture, intracranial injury, laceration    Co morbidities that complicate the patient evaluation  Brain tumor excised at [facility] at 27yo, bipolar disorder, social anxiety    Additional history obtained:  External records from outside source obtained and  reviewed including prior head CT on file dated 06/03/21   Imaging Studies ordered:  I ordered imaging studies including CT head, c-spine  I independently visualized and interpreted imaging which showed no midline shift, no skull fracture  I agree with the radiologist interpretation- concern for subarachnoid hemorrhage    Consultations Obtained:  I requested consultation with Dr. Lovell Sheehan with neurosurgery,  and discussed lab and imaging findings as well as pertinent plan - they recommend: has reviewed the images, likely radiology over read, can follow up in clinic in headaches or other concerns persist.    Problem List / ED Course / Critical interventions / Medication management  27 year old male presents with concern for laceration to the posterior/parietal scalp. States he tripped and fell backwards, hit the back of his head on the burn, unsure if he blacked out, denies loss of awareness. Reports headache, no vomiting, no other  concerns. CT head and c-spine obtained, radiologist with concern for [facility], discussed with neurosurgery who has reviewed images, feels likely over read but patient can follow up for any persistent symptoms. Wound was anesthetized, irrigated, closed with staples. Return precautions discussed, provided with info for neuro surgery to follow up as needed.  I have reviewed the patients home medicines and have made adjustments as needed   Social Determinants of Health:  No PCP on file    Test / Admission - Considered:  Stable for dc with referral to neurosurgery for follow up if needed and return to ER precautions          Final Clinical Impression(s) / ED Diagnoses Final diagnoses:  Laceration of scalp, initial encounter  Traumatic subarachnoid hemorrhage with loss of consciousness of 30 minutes or less, initial encounter [facility])    Rx / DC Orders ED Discharge Orders     None         Jeannie Fend, PA-C 09/13/23 1703    Benjiman Core,  MD 09/16/23 [phone]

## 2023-09-13 NOTE — ED Notes (Signed)
Attempted to clean up as much blood as I could off patient face, neck, and hair.

## 2023-09-13 NOTE — ED Triage Notes (Signed)
Pt states he fell on sidewalk approx 1.5hrs ago, "busted my head a little bit." States he passed out after. Ambulatory to triage w steady, even gait.

## 2023-09-13 NOTE — ED Notes (Signed)
RN reviewed discharge instructions with pt. Pt verbalized understanding and had no further questions. VSS upon discharge.

## 2023-10-25 ENCOUNTER — Ambulatory Visit (HOSPITAL_COMMUNITY)
Admission: EM | Admit: 2023-10-25 | Discharge: 2023-10-26 | Discharge status: Against Medical Advice | Payer: MEDICAID | Attending: Psychiatry | Admitting: Psychiatry

## 2023-10-25 DIAGNOSIS — F1110 Opioid abuse, uncomplicated: Secondary | ICD-10-CM | POA: Insufficient documentation

## 2023-10-25 DIAGNOSIS — F319 Bipolar disorder, unspecified: Secondary | ICD-10-CM | POA: Insufficient documentation

## 2023-10-25 DIAGNOSIS — L03114 Cellulitis of left upper limb: Secondary | ICD-10-CM | POA: Insufficient documentation

## 2023-10-25 DIAGNOSIS — F339 Major depressive disorder, recurrent, unspecified: Secondary | ICD-10-CM

## 2023-10-25 DIAGNOSIS — Z5900 Homelessness unspecified: Secondary | ICD-10-CM | POA: Insufficient documentation

## 2023-10-25 LAB — POCT URINE DRUG SCREEN - MANUAL ENTRY (I-SCREEN)
POC Amphetamine UR: NOT DETECTED
POC Buprenorphine (BUP): NOT DETECTED
POC Cocaine UR: NOT DETECTED
POC Marijuana UR: NOT DETECTED
POC Methadone UR: NOT DETECTED
POC Methamphetamine UR: POSITIVE — AB
POC Morphine: NOT DETECTED
POC Oxazepam (BZO): NOT DETECTED
POC Oxycodone UR: NOT DETECTED
POC Secobarbital (BAR): NOT DETECTED

## 2023-10-25 MED ORDER — ZIPRASIDONE MESYLATE 20 MG IM SOLR: 20.0000 mg | INTRAMUSCULAR | Status: DC | PRN

## 2023-10-25 MED ORDER — ALUM & MAG HYDROXIDE-SIMETH 200-200-20 MG/5ML PO SUSP: 30.0000 mL | ORAL | Status: DC | PRN

## 2023-10-25 MED ORDER — METHOCARBAMOL 500 MG PO TABS: 500.0000 mg | ORAL_TABLET | Freq: Three times a day (TID) | ORAL | Status: DC | PRN

## 2023-10-25 MED ORDER — HYDROXYZINE HCL 25 MG PO TABS: 25.0000 mg | ORAL_TABLET | Freq: Four times a day (QID) | ORAL | Status: DC | PRN

## 2023-10-25 MED ORDER — ACETAMINOPHEN 325 MG PO TABS
650.0000 mg | ORAL_TABLET | Freq: Four times a day (QID) | ORAL | Status: DC | PRN
  Administered 2023-10-26: 650 mg via ORAL
  Filled 2023-10-25: qty 2

## 2023-10-25 MED ORDER — DICYCLOMINE HCL 20 MG PO TABS: 20.0000 mg | ORAL_TABLET | Freq: Four times a day (QID) | ORAL | Status: DC | PRN

## 2023-10-25 MED ORDER — MAGNESIUM HYDROXIDE 400 MG/5ML PO SUSP: 30.0000 mL | Freq: Every day | ORAL | Status: DC | PRN

## 2023-10-25 MED ORDER — OLANZAPINE 5 MG PO TBDP: 5.0000 mg | ORAL_TABLET | Freq: Three times a day (TID) | ORAL | Status: DC | PRN

## 2023-10-25 MED ORDER — ONDANSETRON 4 MG PO TBDP: 4.0000 mg | ORAL_TABLET | Freq: Four times a day (QID) | ORAL | Status: DC | PRN

## 2023-10-25 MED ORDER — NAPROXEN 500 MG PO TABS: 500.0000 mg | ORAL_TABLET | Freq: Two times a day (BID) | ORAL | Status: DC | PRN

## 2023-10-25 MED ORDER — LOPERAMIDE HCL 2 MG PO CAPS: 2.0000 mg | ORAL_CAPSULE | ORAL | Status: DC | PRN

## 2023-10-25 NOTE — ED Notes (Signed)
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

## 2023-10-25 NOTE — ED Provider Notes (Signed)
[facility]gent [facility] Continuous Assessment Admission H&P  Date: 10/26/23 Patient Name: Brad Evans MRN: [id number] Chief Complaint: my parent want me to get help from fentanyl   Diagnoses:  Final diagnoses:  Opioid abuse (HCC)  Recurrent major depressive disorder, remission status unspecified (HCC)  Homelessness unspecified    HPI: Brad Evans,  27 y/o male with a history of MDD, opioid abuse, presented to [facility] voluntarily accompanied by family members.  Per the patient he came because his parents want him to get help for his opioid abuse.  According to patient he last used fentanyl prior to coming this evening.  Patient reports he has been using fentanyl for the past 2 to 3 years when asked where is getting fentanyl patient stated he is getting it off the street.  According to the patient he has been using fentanyl, methamphetamines, and cocaine.  Patient is currently unemployed and according to him he has been living out of his [employer] because he is homeless and his parents had put about 8 months ago.  Patient appearance is very disheveled does appear to be under the influence of drugs.  However patient stated he just use before coming in.  Patient stated that he was diagnosed with bipolar disorder but he is not taking any medicines.  Patient also stated he used to go to a Suboxone clinic but also around 3 months ago he has not been there.   Face-to-face observation of patient, patient is alert and oriented to person place and time, does appear to be under the influence of drugs.  Patient denies SI, HI, AVH or paranoia.  Reports he has been using fentanyl today.  According to the patient he last used cocaine and methamphetamines 2 to 3 days ago.  According to patient he has been using drugs for the past 3 to 4 years.  Patient denies alcohol use.  Denies using any other drugs.  Patient does not appear to be a risk to others. Plan of care include starting patient on withdrawal protocol as part of  the treatment plan.    Nursing staff reported that patient want to leave,  writer did go back and speak with patient,  he finally decided he was going to stay. Will admit to [facility] to ensure that patient will stay,  will re-assess for [facility] in the AM.   Recommend inpatient observation for further evaluation FBC  Total Time spent with patient: 20 minutes  Musculoskeletal  Strength & Muscle Tone: within normal limits Gait & Station: normal Patient leans: N/A  Psychiatric Specialty Exam  Presentation General Appearance:  Bizarre  Eye Contact: Fair  Speech: Clear and Coherent  Speech Volume: Normal  Handedness:No data recorded  Mood and Affect  Mood: Anxious  Affect: Congruent   Thought Process  Thought Processes: Coherent  Descriptions of Associations:Intact  Orientation:Full (Time, Place and Person)  Thought Content:WDL    Hallucinations:Hallucinations: None  Ideas of Reference:None  Suicidal Thoughts:Suicidal Thoughts: No  Homicidal Thoughts:Homicidal Thoughts: No   Sensorium  Memory: Immediate Fair  Judgment: Poor  Insight: Poor   Executive Functions  Concentration: Fair  Attention Span: Fair  Recall: Fair  Fund of Knowledge: Fair  Language: Fair   Psychomotor Activity  Psychomotor Activity: Psychomotor Activity: Normal   Assets  Assets: Desire for Improvement; Resilience   Sleep  Sleep: Sleep: Fair Number of Hours of Sleep: 6   Nutritional Assessment (For OBS and FBC admissions only) Has the patient had a weight loss or gain of  10 pounds or more in the last 3 months?: No Has the patient had a decrease in food intake/or appetite?: No Does the patient have dental problems?: No Does the patient have eating habits or behaviors that may be indicators of an eating disorder including binging or inducing vomiting?: No Has the patient recently lost weight without trying?: 0 Has the patient been eating poorly because of a  decreased appetite?: 0 Malnutrition Screening Tool Score: 0    Physical Exam HENT:     Head: Normocephalic.     Nose: Nose normal.  Eyes:     Pupils: Pupils are equal, round, and reactive to light.  Cardiovascular:     Rate and Rhythm: Normal rate.  Pulmonary:     Effort: Pulmonary effort is normal.  Musculoskeletal:        General: Normal range of motion.     Cervical back: Normal range of motion.  Neurological:     General: No focal deficit present.     Mental Status: He is alert.  Psychiatric:        Mood and Affect: Mood normal.        Behavior: Behavior normal.        Thought Content: Thought content normal.        Judgment: Judgment normal.    Review of Systems  Constitutional: Negative.   HENT: Negative.    Eyes: Negative.   Respiratory: Negative.    Cardiovascular: Negative.   Gastrointestinal: Negative.   Genitourinary: Negative.   Musculoskeletal: Negative.   Skin: Negative.   Neurological: Negative.   Psychiatric/Behavioral:  Positive for depression and substance abuse. The patient is nervous/anxious.     Blood pressure 122/72, pulse 90, temperature 98.8 F (37.1 C), temperature source Oral, resp. rate 18, SpO2 99%. There is no height or weight on file to calculate BMI.  Past Psychiatric History: Fentanyl abuse, cocaine abuse, meth   Is the patient at risk to self? No  Has the patient been a risk to self in the past 6 months? No .    Has the patient been a risk to self within the distant past? No   Is the patient a risk to others? No   Has the patient been a risk to others in the past 6 months? No   Has the patient been a risk to others within the distant past? No   Past Medical History: see chart  Family History: unknown   Social History: polysubstance abuse,    Last Labs:  Admission on 10/25/2023  Component Date Value Ref Range Status   POC Amphetamine UR 10/25/2023 None Detected  NONE DETECTED (Cut Off Level 1000 ng/mL) Final   POC  Secobarbital (BAR) 10/25/2023 None Detected  NONE DETECTED (Cut Off Level 300 ng/mL) Final   POC Buprenorphine (BUP) 10/25/2023 None Detected  NONE DETECTED (Cut Off Level 10 ng/mL) Final   POC Oxazepam (BZO) 10/25/2023 None Detected  NONE DETECTED (Cut Off Level 300 ng/mL) Final   POC Cocaine UR 10/25/2023 None Detected  NONE DETECTED (Cut Off Level 300 ng/mL) Final   POC Methamphetamine UR 10/25/2023 Positive (A)  NONE DETECTED (Cut Off Level 1000 ng/mL) Final   POC Morphine 10/25/2023 None Detected  NONE DETECTED (Cut Off Level 300 ng/mL) Final   POC Methadone UR 10/25/2023 None Detected  NONE DETECTED (Cut Off Level 300 ng/mL) Final   POC Oxycodone UR 10/25/2023 None Detected  NONE DETECTED (Cut Off Level 100 ng/mL) Final   POC Marijuana UR  10/25/2023 None Detected  NONE DETECTED (Cut Off Level 50 ng/mL) Final  Admission on 06/29/2023, Discharged on 06/29/2023  Component Date Value Ref Range Status   WBC 06/28/2023 5.3  4.0 - 10.5 K/uL Final   RBC 06/28/2023 5.44  4.22 - 5.81 MIL/uL Final   Hemoglobin 06/28/2023 14.2  13.0 - 17.0 g/dL Final   HCT 40/98/1191 42.0  39.0 - 52.0 % Final   MCV 06/28/2023 77.2 (L)  80.0 - 100.0 fL Final   MCH 06/28/2023 26.1  26.0 - 34.0 pg Final   MCHC 06/28/2023 33.8  30.0 - 36.0 g/dL Final   RDW 47/82/9562 14.3  11.5 - 15.5 % Final   Platelets 06/28/2023 341  150 - 400 K/uL Final   nRBC 06/28/2023 0.0  0.0 - 0.2 % Final   Performed at [facility], [address] [place], [place] [address]   Sodium 06/28/2023 143  135 - 145 mmol/L Final   Potassium 06/28/2023 3.9  3.5 - 5.1 mmol/L Final   Chloride 06/28/2023 106  98 - 111 mmol/L Final   CO2 06/28/2023 28  22 - 32 mmol/L Final   Glucose, Bld 06/28/2023 93  70 - 99 mg/dL Final   Glucose reference range applies only to samples taken after fasting for at least 8 hours.   BUN 06/28/2023 21 (H)  6 - 20 mg/dL Final   Creatinine, Ser 06/28/2023 1.18  0.61 - 1.24 mg/dL Final   Calcium  57/84/6962 9.8  8.9 - 10.3 mg/dL Final   GFR, Estimated 06/28/2023 >60  >60 mL/min Final   Comment: (NOTE) Calculated using the CKD-EPI Creatinine Equation (2021)    Anion gap 06/28/2023 9  5 - 15 Final   Performed at [facility], [address], [place], [place] [address]   Total CK 06/28/2023 333  49 - 397 U/L Final   Performed at [facility], [address] [place], [place] [address]  Admission on 05/08/2023, Discharged on 05/08/2023  Component Date Value Ref Range Status   Sodium 05/08/2023 142  135 - 145 mmol/L Final   Potassium 05/08/2023 3.7  3.5 - 5.1 mmol/L Final   Chloride 05/08/2023 104  98 - 111 mmol/L Final   CO2 05/08/2023 28  22 - 32 mmol/L Final   Glucose, Bld 05/08/2023 102 (H)  70 - 99 mg/dL Final   Glucose reference range applies only to samples taken after fasting for at least 8 hours.   BUN 05/08/2023 18  6 - 20 mg/dL Final   Creatinine, Ser 05/08/2023 0.96  0.61 - 1.24 mg/dL Final   Calcium 12/30/7251 9.3  8.9 - 10.3 mg/dL Final   Total Protein 66/44/0347 7.5  6.5 - 8.1 g/dL Final   Albumin 42/59/5638 4.4  3.5 - 5.0 g/dL Final   AST 75/64/3329 22  15 - 41 U/L Final   ALT 05/08/2023 27  0 - 44 U/L Final   Alkaline Phosphatase 05/08/2023 120  38 - 126 U/L Final   Total Bilirubin 05/08/2023 0.5  0.3 - 1.2 mg/dL Final   GFR, Estimated 05/08/2023 >60  >60 mL/min Final   Comment: (NOTE) Calculated using the CKD-EPI Creatinine Equation (2021)    Anion gap 05/08/2023 10  5 - 15 Final   Performed at [facility], [address] [place], [place] [address]   WBC 05/08/2023 5.8  4.0 - 10.5 K/uL Final   RBC 05/08/2023 5.89 (H)  4.22 - 5.81 MIL/uL Final   Hemoglobin 05/08/2023 15.0  13.0 - 17.0 g/dL Final   HCT 14/78/2956 45.5  39.0 - 52.0 % Final   MCV 05/08/2023 77.2 (L)  80.0 - 100.0 fL Final   MCH 05/08/2023 25.5 (L)  26.0 - 34.0 pg Final   MCHC 05/08/2023 33.0  30.0 - 36.0 g/dL Final   RDW 21/30/8657  16.8 (H)  11.5 - 15.5 % Final   Platelets 05/08/2023 267  150 - 400 K/uL Final   nRBC 05/08/2023 0.0  0.0 - 0.2 % Final   Neutrophils Relative % 05/08/2023 62  % Final   Neutro Abs 05/08/2023 3.5  1.7 - 7.7 K/uL Final   Lymphocytes Relative 05/08/2023 27  % Final   Lymphs Abs 05/08/2023 1.6  0.7 - 4.0 K/uL Final   Monocytes Relative 05/08/2023 11  % Final   Monocytes Absolute 05/08/2023 0.7  0.1 - 1.0 K/uL Final   Eosinophils Relative 05/08/2023 0  % Final   Eosinophils Absolute 05/08/2023 0.0  0.0 - 0.5 K/uL Final   Basophils Relative 05/08/2023 0  % Final   Basophils Absolute 05/08/2023 0.0  0.0 - 0.1 K/uL Final   Immature Granulocytes 05/08/2023 0  % Final   Abs Immature Granulocytes 05/08/2023 0.01  0.00 - 0.07 K/uL Final   Performed at [facility], [address] [place], [place] [address]   Specimen Description 05/08/2023    Final                   Value:BLOOD BLOOD RIGHT ARM Performed at [facility], [address] [place], [place] [address]    Special Requests 05/08/2023    Final                   Value:BOTTLES DRAWN AEROBIC AND ANAEROBIC Blood Culture adequate volume Performed at [facility], [address] [place], [place] [address]    Culture 05/08/2023    Final                   Value:NO GROWTH 5 DAYS Performed at [facility], [address], [place], [place] [address]    Report Status 05/08/2023 05/13/2023 FINAL   Final   Lactic Acid, Venous 05/08/2023 1.3  0.5 - 1.9 mmol/L Final   Performed at [facility], [address] [place], [place] [address]    Allergies: Patient has no known allergies.  Medications:  Facility Ordered Medications  Medication   acetaminophen (TYLENOL) tablet 650 mg   alum & mag hydroxide-simeth (MAALOX/MYLANTA) 200-200-20 MG/5ML suspension 30 mL   magnesium hydroxide (MILK OF MAGNESIA) suspension 30 mL   dicyclomine (BENTYL) tablet 20 mg    hydrOXYzine (ATARAX) tablet 25 mg   loperamide (IMODIUM) capsule 2-4 mg   methocarbamol (ROBAXIN) tablet 500 mg   naproxen (NAPROSYN) tablet 500 mg   ondansetron (ZOFRAN-ODT) disintegrating tablet 4 mg   OLANZapine zydis (ZYPREXA) disintegrating tablet 5 mg   And   ziprasidone (GEODON) injection 20 mg   PTA Medications  Medication Sig   sertraline (ZOLOFT) 50 MG tablet Take 50 mg by mouth every morning.   Lactulose 20 GM/30ML SOLN Take 30 mLs (20 g total) by mouth 2 (two) times daily.   doxycycline (VIBRAMYCIN) 100 MG capsule Take 1 capsule (100 mg total) by mouth 2 (two) times daily.   ibuprofen (ADVIL) 600 MG tablet Take 1 tablet (600 mg total) by mouth every 6 (six) hours as needed.      Medical Decision Making  Inpatient  observation,     Lab Orders         CBC with Differential/Platelet         Comprehensive metabolic panel         Ethanol         TSH         POCT Urine Drug Screen - (I-Screen)      Meds ordered this encounter  Medications   acetaminophen (TYLENOL) tablet 650 mg   alum & mag hydroxide-simeth (MAALOX/MYLANTA) 200-200-20 MG/5ML suspension 30 mL   magnesium hydroxide (MILK OF MAGNESIA) suspension 30 mL   dicyclomine (BENTYL) tablet 20 mg   hydrOXYzine (ATARAX) tablet 25 mg   loperamide (IMODIUM) capsule 2-4 mg   methocarbamol (ROBAXIN) tablet 500 mg   naproxen (NAPROSYN) tablet 500 mg   ondansetron (ZOFRAN-ODT) disintegrating tablet 4 mg   AND Linked Order Group    OLANZapine zydis (ZYPREXA) disintegrating tablet 5 mg    ziprasidone (GEODON) injection 20 mg     Recommendations  Based on my evaluation the patient does not appear to have an emergency medical condition.  Sindy Guadeloupe, NP 10/26/23  5:59 AM

## 2023-10-25 NOTE — ED Notes (Signed)
Patient has been familiarized with unit and brought on unit. Patient in no acute distress, will continue to monitor for safety.

## 2023-10-25 NOTE — Progress Notes (Signed)
   10/25/23 2140  [facility] Triage Screening (Walk-ins at [facility] only)  How Did You Hear About [place]? Family/Friend  What Is the Reason for Your Visit/Call Today? Brad Evans is a 27 year old male presenting as a voluntary walk-in to [facility] requesting opioid detox. Patient denied SI, HI and psychosis. Patient reports last usage of fentanyl was today. Patient reports using half a gram of fentanyl daily. Patient reports he has been using for the past 3-4 years. Patient reports withdrawals includes nauseous, dry heaving, diarrhea, cold/hot flashes, restlessness and poor sleep. Patient reports history of psych hospitalization over 1 year ago, however unable to tell why he was inpatient. Patient also reports DUI 3-4 months ago with court date in 10/2023.  How Long Has This Been Causing You Problems? > than 6 months  Have You Recently Had Any Thoughts About Hurting Yourself? No  Are You Planning to Commit Suicide/Harm Yourself At This time? No  Have you Recently Had Thoughts About Hurting Someone [name]? No  Are You Planning To Harm Someone At This Time? No  Are you currently experiencing any auditory, visual or other hallucinations? No  Have You Used Any Alcohol or Drugs in the Past 24 Hours? Yes  How long ago did you use Drugs or Alcohol? fentanyl  What Did You Use and How Much? half a gram of fentanyl  Do you have any current medical co-morbidities that require immediate attention? No  Clinician description of patient physical appearance/behavior: disheveled / cooperative  What Do You Feel Would Help You the Most Today? Alcohol or Drug Use Treatment  If access to [facility] Ur[facility] was not available, would you have sought care in the Emergency Department? Yes  Determination of Need Urgent (48 hours)  Options For Referral Facility-Based Crisis;Medication Management;BH Urgent Care    Flowsheet Row ED from 10/25/2023 in [facility] ED from 09/13/2023 in [facility] Emergency Department  at [facility] ED from 06/29/2023 in [facility] Emergency Department at [facility]  C-SSRS RISK CATEGORY No Risk No Risk No Risk

## 2023-10-26 ENCOUNTER — Encounter (HOSPITAL_COMMUNITY): Payer: Self-pay | Admitting: Emergency Medicine

## 2023-10-26 LAB — CBC WITH DIFFERENTIAL/PLATELET
Abs Immature Granulocytes: 0.03 10*3/uL (ref 0.00–0.07)
Basophils Absolute: 0 10*3/uL (ref 0.0–0.1)
Basophils Relative: 0 %
Eosinophils Absolute: 0.2 10*3/uL (ref 0.0–0.5)
Eosinophils Relative: 3 %
HCT: 41.1 % (ref 39.0–52.0)
Hemoglobin: 13.3 g/dL (ref 13.0–17.0)
Immature Granulocytes: 0 %
Lymphocytes Relative: 21 %
Lymphs Abs: 1.8 10*3/uL (ref 0.7–4.0)
MCH: 26.4 pg (ref 26.0–34.0)
MCHC: 32.4 g/dL (ref 30.0–36.0)
MCV: 81.7 fL (ref 80.0–100.0)
Monocytes Absolute: 0.6 10*3/uL (ref 0.1–1.0)
Monocytes Relative: 7 %
Neutro Abs: 5.8 10*3/uL (ref 1.7–7.7)
Neutrophils Relative %: 69 %
Platelets: 402 10*3/uL — ABNORMAL HIGH (ref 150–400)
RBC: 5.03 MIL/uL (ref 4.22–5.81)
RDW: 13.5 % (ref 11.5–15.5)
WBC: 8.4 10*3/uL (ref 4.0–10.5)
nRBC: 0 % (ref 0.0–0.2)

## 2023-10-26 LAB — COMPREHENSIVE METABOLIC PANEL
ALT: 39 U/L (ref 0–44)
AST: 31 U/L (ref 15–41)
Albumin: 3 g/dL — ABNORMAL LOW (ref 3.5–5.0)
Alkaline Phosphatase: 105 U/L (ref 38–126)
Anion gap: 8 (ref 5–15)
BUN: 10 mg/dL (ref 6–20)
CO2: 31 mmol/L (ref 22–32)
Calcium: 9 mg/dL (ref 8.9–10.3)
Chloride: 101 mmol/L (ref 98–111)
Creatinine, Ser: 1 mg/dL (ref 0.61–1.24)
GFR, Estimated: >60 mL/min (ref >60)
Glucose, Bld: 103 mg/dL — ABNORMAL HIGH (ref 70–99)
Potassium: 4.4 mmol/L (ref 3.5–5.1)
Sodium: 140 mmol/L (ref 135–145)
Total Bilirubin: 0.4 mg/dL (ref 0.3–1.2)
Total Protein: 7.2 g/dL (ref 6.5–8.1)

## 2023-10-26 LAB — ETHANOL: Alcohol, Ethyl (B): <10 mg/dL (ref <10)

## 2023-10-26 LAB — TSH: TSH: 1.13 u[IU]/mL (ref 0.350–4.500)

## 2023-10-26 MED ORDER — SULFAMETHOXAZOLE-TRIMETHOPRIM 800-160 MG PO TABS: 1.0000 | ORAL_TABLET | Freq: Two times a day (BID) | ORAL | Status: DC

## 2023-10-26 MED ORDER — SULFAMETHOXAZOLE-TRIMETHOPRIM 800-160 MG PO TABS
1.0000 | ORAL_TABLET | Freq: Two times a day (BID) | ORAL | Status: DC
  Administered 2023-10-26: 1 via ORAL
  Filled 2023-10-26: qty 1

## 2023-10-26 NOTE — ED Notes (Signed)
Pt A&O x 4, sitting on bed, stating he wants to speak with Provider, he does not want to be transferred to [facility], he wants to be discharged from facility.  RN stated she would let Provider know.

## 2023-10-26 NOTE — BH Assessment (Addendum)
Comprehensive Clinical Assessment (CCA) Note  10/26/2023 Brad Evans [id number]  Disposition: Sindy Guadeloupe, NP, recommends continuous observation with further evaluation for [facility].   The patient demonstrates the following risk factors for suicide: Chronic risk factors for suicide include: substance use disorder. Acute risk factors for suicide include: unemployment, social withdrawal/isolation, and loss (financial, interpersonal, professional). Protective factors for this patient include: responsibility to others (children, family) and hope for the future. Considering these factors, the overall suicide risk at this point appears to be high. Patient is not appropriate for outpatient follow up.  Brad Evans is a 27 year old male presenting as a voluntary walk-in to [facility] requesting opioid detox. Patient denied SI, HI and psychosis. Patient reports last usage of fentanyl was today. Patient reports using half a gram of fentanyl daily. Patient reports he has been using for the past 3-4 years. Patient reports withdrawals includes nauseous, dry heaving, diarrhea, cold/hot flashes, restlessness and poor sleep.   Patient reports history of psych hospitalization over 1 year ago, however unable to tell why he was inpatient. Patient denied prior suicide attempts and self-harming behaviors. Patient also reports DUI 3-4 months ago with court date in 10/2023.   Patient has been homeless for approx 8 months and living out of his [address] Niece. Patient is currently unemployed. Patient has supportive parents. Patient denied access to guns. Patient was calm and cooperative during assessment. Patient seeking opioid detox.   Chief Complaint:  Chief Complaint  Patient presents with   Addiction Problem   Visit Diagnosis:  Opioid Dependence    CCA Screening, Triage and Referral (STR)  Patient Reported Information How did you hear about [place]? Family/Friend  What Is the Reason for Your Visit/Call Today? Brad Evans is a 27 year old male presenting as a voluntary walk-in to [facility] requesting opioid detox. Patient denied SI, HI and psychosis. Patient reports last usage of fentanyl was today. Patient reports using half a gram of fentanyl daily. Patient reports he has been using for the past 3-4 years. Patient reports withdrawals includes nauseous, dry heaving, diarrhea, cold/hot flashes, restlessness and poor sleep. Patient reports history of psych hospitalization over 1 year ago, however unable to tell why he was inpatient. Patient also reports DUI 3-4 months ago with court date in 10/2023.  How Long Has This Been Causing You Problems? > than 6 months  What Do You Feel Would Help You the Most Today? Alcohol or Drug Use Treatment   Have You Recently Had Any Thoughts About Hurting Yourself? No  Are You Planning to Commit Suicide/Harm Yourself At This time? No   Flowsheet Row ED from 10/25/2023 in [facility] ED from 09/13/2023 in [facility] Emergency Department at [facility] ED from 06/29/2023 in [facility] Emergency Department at [facility]  C-SSRS RISK CATEGORY No Risk No Risk No Risk       Have you Recently Had Thoughts About Hurting Someone [name]? No  Are You Planning to Harm Someone at This Time? No  Explanation: n/a   Have You Used Any Alcohol or Drugs in the Past 24 Hours? Yes  What Did You Use and How Much? half a gram of fentanyl   Do You Currently Have a Therapist/Psychiatrist? No  Name of Therapist/Psychiatrist: Name of Therapist/Psychiatrist: n/a   Have You Been Recently Discharged From Any Office Practice or Programs? No  Explanation of Discharge From Practice/Program: n/a     CCA Screening Triage Referral Assessment Type of Contact: Face-to-Face  Telemedicine  Service Delivery:   Is this Initial or Reassessment?   Date Telepsych consult ordered in [facility]:    Time Telepsych consult ordered in [facility]:    Location of Assessment:  [facility] Assessment Services  Provider Location: [facility] Assessment Services   Collateral Involvement: none reported   Does Patient Have a Automotive engineer Guardian? No  Legal Guardian Contact Information: n/a  Copy of Legal Guardianship Form: -- (n/a)  Legal Guardian Notified of Arrival: -- (n/a)  Legal Guardian Notified of Pending Discharge: -- (n/a)  If Minor and Not Living with Parent(s), Who has Custody? n/a  Is CPS involved or ever been involved? Never  Is APS involved or ever been involved? Never   Patient Determined To Be At Risk for Harm To Self or Others Based on Review of Patient Reported Information or Presenting Complaint? No  Method: No Plan  Availability of Means: No access or NA  Intent: Vague intent or NA  Notification Required: No need or identified person  Additional Information for Danger to Others Potential: -- (n/a)  Additional Comments for Danger to Others Potential: n/a  Are There Guns or Other Weapons in Your Home? No  Types of Guns/Weapons: n/a  Are These Weapons Safely Secured?                            -- (n/a)  Who Could Verify You Are Able To Have These Secured: n/a  Do You Have any Outstanding Charges, Pending Court Dates, Parole/Probation? none reported  Contacted To Inform of Risk of Harm To Self or Others: Other: Comment    Does Patient Present under Involuntary Commitment? No    [place] of Residence: [place]   Patient Currently Receiving the Following Services: Not Receiving Services   Determination of Need: Urgent (48 hours)   Options For Referral: Facility-Based Crisis; Medication Management; BH Urgent Care     CCA Biopsychosocial Patient Reported Schizophrenia/Schizoaffective Diagnosis in Past: No   Strengths: self awareness   Mental Health Symptoms Depression:   None   Duration of Depressive symptoms:    Mania:   None   Anxiety:    None   Psychosis:   None   Duration of Psychotic  symptoms:    Trauma:   None   Obsessions:   None   Compulsions:   None   Inattention:   None   Hyperactivity/Impulsivity:   None   Oppositional/Defiant Behaviors:   None   Emotional Irregularity:   None   Other Mood/Personality Symptoms:  n/a   Mental Status Exam Appearance and self-care  Stature:   Average   Weight:   Average weight   Clothing:   Disheveled   Grooming:   Neglected   Cosmetic use:   None   Posture/gait:   Normal   Motor activity:   Not Remarkable   Sensorium  Attention:   Normal   Concentration:   Normal   Orientation:   X5   Recall/memory:   Normal   Affect and Mood  Affect:   Depressed   Mood:   Hopeless   Relating  Eye contact:   Normal   Facial expression:   Depressed   Attitude toward examiner:   Cooperative   Thought and Language  Speech flow:  Normal   Thought content:   Appropriate to Mood and Circumstances   Preoccupation:   None   Hallucinations:   None   Organization:   Coherent  Company secretary of Knowledge:   Average   Intelligence:   Average   Abstraction:   Normal   Judgement:   Impaired   Reality Testing:   Adequate   Insight:   Fair   Decision Making:   Normal   Social Functioning  Social Maturity:   Isolates   Social Judgement:   Naive   Stress  Stressors:   Family conflict; Financial   Coping Ability:   Overwhelmed   Skill Deficits:   Decision making; Self-control   Supports:   Family     Religion: Religion/Spirituality Are You A Religious Person?:  (unable to assess) How Might This Affect Treatment?: n/a  Leisure/Recreation: Leisure / Recreation Do You Have Hobbies?: No  Exercise/Diet: Exercise/Diet Do You Exercise?: No Have You Gained or Lost A Significant Amount of Weight in the Past Six Months?: No Do You Follow a Special Diet?: No Do You Have Any Trouble Sleeping?: No   CCA Employment/Education Employment/Work  Situation: Employment / Work Situation Employment Situation: Unemployed Patient's Job has Been Impacted by Current Illness:  (n/a)  Education: Education Is Patient Currently Attending School?: No School Currently Attending: n/a Last Grade Completed: 12 Did You Product manager?: No Did You Have An Individualized Education Program (IIEP): No Did You Have Any Difficulty At School?: No Patient's Education Has Been Impacted by Current Illness: No   CCA Family/Childhood History Family and Relationship History: Family history Marital status: Single Does patient have children?: No  Childhood History:  Childhood History By whom was/is the patient raised?: Both parents Did patient suffer any verbal/emotional/physical/sexual abuse as a child?: No Did patient suffer from severe childhood neglect?: No Has patient ever been sexually abused/assaulted/raped as an adolescent or adult?: No Was the patient ever a victim of a crime or a disaster?: No Witnessed domestic violence?: No Has patient been affected by domestic violence as an adult?: No       CCA Substance Use Alcohol/Drug Use: Alcohol / Drug Use Pain Medications: see MAR Prescriptions: see MAR Over the Counter: see MAR History of alcohol / drug use?: Yes Longest period of sobriety (when/how long): n/a Negative Consequences of Use:  (n/a) Withdrawal Symptoms: Weakness, Nausea / Vomiting, Fever / Chills, Diarrhea Substance #1 Name of Substance 1: opioids 1 - Age of First Use: 23 1 - Amount (size/oz): half gram 1 - Frequency: daily 1 - Last Use / Amount: today                       ASAM's:  Six Dimensions of Multidimensional Assessment  Dimension 1:  Acute Intoxication and/or Withdrawal Potential:   Dimension 1:  Description of individual's past and current experiences of substance use and withdrawal: Ongoing usage.  Dimension 2:  Biomedical Conditions and Complications:   Dimension 2:  Description of patient's  biomedical conditions and  complications: No medical concerns reported.  Dimension 3:  Emotional, Behavioral, or Cognitive Conditions and Complications:  Dimension 3:  Description of emotional, behavioral, or cognitive conditions and complications: Patient denied mental health concerns.  Dimension 4:  Readiness to Change:  Dimension 4:  Description of Readiness to Change criteria: Patient seeking treatment.  Dimension 5:  Relapse, Continued use, or Continued Problem Potential:  Dimension 5:  Relapse, continued use, or continued problem potential critiera description: Continued usage.  Dimension 6:  Recovery/Living Environment:  Dimension 6:  Recovery/Iiving environment criteria description: Resides alone.  ASAM Severity Score:    ASAM Recommended Level of  Treatment: ASAM Recommended Level of Treatment: Level III Residential Treatment   Substance use Disorder (SUD) Substance Use Disorder (SUD)  Checklist Symptoms of Substance Use: Substance(s) often taken in larger amounts or over longer times than was intended, Recurrent use that results in a failure to fulfill major role obligations (work, school, home), Persistent desire or unsuccessful efforts to cut down or control use, Large amounts of time spent to obtain, use or recover from the substance(s), Evidence of tolerance, Continued use despite persistent or recurrent social, interpersonal problems, caused or exacerbated by use, Continued use despite having a persistent/recurrent physical/psychological problem caused/exacerbated by use  Recommendations for Services/Supports/Treatments: Recommendations for Services/Supports/Treatments Recommendations For Services/Supports/Treatments: SAIOP (Substance Abuse Intensive Outpatient Program), Medication Management, Facility Based Crisis, Other (Comment), Individual Therapy  Discharge Disposition: Discharge Disposition Medical Exam completed: Yes  DSM5 Diagnoses: Patient Active Problem List   Diagnosis  Date Noted   Drug abuse, 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

## 2023-10-26 NOTE — ED Notes (Signed)
Pt signed AMA form .  Left [facility] A&O x 4, no distress noted. As per NP Sindy Guadeloupe.

## 2023-10-26 NOTE — ED Notes (Signed)
Pt is currently sleeping, no distress noted, environmental check complete, will continue to monitor patient for safety.  

## 2023-10-26 NOTE — Progress Notes (Signed)
Multiple attempts to encourage patient to reconsider [facility] option for opioid substance abuse was unsucessful.  Patient adamant that he "wants to leave and go smoke. I don't like being cooped up.  I want to be able to walk outside."  Patient provided further verbal encouragement with offer of the private room he would be afforded in [facility] and nursing staff would make all attempts to manage his withdrawal symptoms.  Patient yet again adamant that he does not want to stay.

## 2023-10-26 NOTE — ED Notes (Signed)
Pt resting quietly, breathing is even and unlabored.  Pt denies SI, HI, pain and AVH. Will continue to monitor for safety and report any COC.

## 2023-10-26 NOTE — ED Provider Notes (Signed)
Behavioral Health Progress Note  Date and Time: 10/26/2023 3:49 PM Name: Brad Evans MRN:  [id number]  Subjective:  ***  Diagnosis:  Final diagnoses:  Opioid abuse (HCC)  Recurrent major depressive disorder, remission status unspecified (HCC)  Homelessness unspecified  Cellulitis of left arm    Total Time spent with patient: {Time; 15 min - 8 hours:17441}  Past Psychiatric History: *** Past Medical History: *** Family History: *** Family Psychiatric  History: *** Social History: ***  Additional Social History:    Pain Medications: see MAR Prescriptions: see MAR Over the Counter: see MAR History of alcohol / drug use?: Yes Longest period of sobriety (when/how long): n/a Negative Consequences of Use:  (n/a) Withdrawal Symptoms: Weakness, Nausea / Vomiting, Fever / Chills, Diarrhea Name of Substance 1: opioids 1 - Age of First Use: 23 1 - Amount (size/oz): half gram 1 - Frequency: daily 1 - Last Use / Amount: today                  Sleep: {BHH GOOD/FAIR/POOR:22877}  Appetite:  {BHH GOOD/FAIR/POOR:22877}  Current Medications:  Current Facility-Administered Medications  Medication Dose Route Frequency Provider Last Rate Last Admin   acetaminophen (TYLENOL) tablet 650 mg  650 mg Oral Q6H PRN Sindy Guadeloupe, NP   650 mg at 10/26/23 1308   alum & mag hydroxide-simeth (MAALOX/MYLANTA) 200-200-20 MG/5ML suspension 30 mL  30 mL Oral Q4H PRN Sindy Guadeloupe, NP       dicyclomine (BENTYL) tablet 20 mg  20 mg Oral Q6H PRN Sindy Guadeloupe, NP       hydrOXYzine (ATARAX) tablet 25 mg  25 mg Oral Q6H PRN Sindy Guadeloupe, NP       loperamide (IMODIUM) capsule 2-4 mg  2-4 mg Oral PRN Sindy Guadeloupe, NP       magnesium hydroxide (MILK OF MAGNESIA) suspension 30 mL  30 mL Oral Daily PRN Sindy Guadeloupe, NP       methocarbamol (ROBAXIN) tablet 500 mg  500 mg Oral Q8H PRN Sindy Guadeloupe, NP       naproxen (NAPROSYN) tablet 500 mg  500 mg Oral BID PRN Sindy Guadeloupe, NP       OLANZapine  zydis (ZYPREXA) disintegrating tablet 5 mg  5 mg Oral Q8H PRN Sindy Guadeloupe, NP       And   ziprasidone (GEODON) injection 20 mg  20 mg Intramuscular PRN Sindy Guadeloupe, NP       ondansetron (ZOFRAN-ODT) disintegrating tablet 4 mg  4 mg Oral Q6H PRN Sindy Guadeloupe, NP       sulfamethoxazole-trimethoprim (BACTRIM DS) 800-160 MG per tablet 1 tablet  1 tablet Oral Q12H Bing Neighbors, NP       Current Outpatient Medications  Medication Sig Dispense Refill   albuterol (PROAIR HFA) 108 (90 Base) MCG/ACT inhaler Inhale 2 puffs into the lungs every 4 (four) hours as needed for wheezing. (Patient not taking: Reported on 10/26/2023)     buprenorphine-naloxone (SUBOXONE) 8-2 mg SUBL SL tablet Place 1 tablet under the tongue 2 (two) times daily. (Patient not taking: Reported on 10/26/2023)     cloNIDine (CATAPRES) 0.1 MG tablet Take 0.1 mg by mouth 2 (two) times daily as needed. (Patient not taking: Reported on 10/26/2023)     fluticasone (FLOVENT HFA) 44 MCG/ACT inhaler Inhale 2 puffs into the lungs as needed. (Patient not taking: Reported on 10/26/2023)     gabapentin (NEURONTIN) 300 MG capsule Take 300 mg by mouth 3 (three) times daily as needed. (Patient  not taking: Reported on 10/26/2023)     ibuprofen (ADVIL) 600 MG tablet Take 1 tablet (600 mg total) by mouth every 6 (six) hours as needed. (Patient not taking: Reported on 10/26/2023) 30 tablet 0   sertraline (ZOLOFT) 50 MG tablet Take 50 mg by mouth every morning. (Patient not taking: Reported on 10/26/2023)  2    Labs  Lab Results:  Admission on 10/25/2023  Component Date Value Ref Range Status   WBC 10/26/2023 8.4  4.0 - 10.5 K/uL Final   RBC 10/26/2023 5.03  4.22 - 5.81 MIL/uL Final   Hemoglobin 10/26/2023 13.3  13.0 - 17.0 g/dL Final   HCT 16/09/9603 41.1  39.0 - 52.0 % Final   MCV 10/26/2023 81.7  80.0 - 100.0 fL Final   MCH 10/26/2023 26.4  26.0 - 34.0 pg Final   MCHC 10/26/2023 32.4  30.0 - 36.0 g/dL Final   RDW 54/08/8118 13.5  11.5  - 15.5 % Final   Platelets 10/26/2023 402 (H)  150 - 400 K/uL Final   nRBC 10/26/2023 0.0  0.0 - 0.2 % Final   Neutrophils Relative % 10/26/2023 69  % Final   Neutro Abs 10/26/2023 5.8  1.7 - 7.7 K/uL Final   Lymphocytes Relative 10/26/2023 21  % Final   Lymphs Abs 10/26/2023 1.8  0.7 - 4.0 K/uL Final   Monocytes Relative 10/26/2023 7  % Final   Monocytes Absolute 10/26/2023 0.6  0.1 - 1.0 K/uL Final   Eosinophils Relative 10/26/2023 3  % Final   Eosinophils Absolute 10/26/2023 0.2  0.0 - 0.5 K/uL Final   Basophils Relative 10/26/2023 0  % Final   Basophils Absolute 10/26/2023 0.0  0.0 - 0.1 K/uL Final   Immature Granulocytes 10/26/2023 0  % Final   Abs Immature Granulocytes 10/26/2023 0.03  0.00 - 0.07 K/uL Final   Performed at [facility], [address] [place], [place] [address]   Sodium 10/26/2023 140  135 - 145 mmol/L Final   Potassium 10/26/2023 4.4  3.5 - 5.1 mmol/L Final   Chloride 10/26/2023 101  98 - 111 mmol/L Final   CO2 10/26/2023 31  22 - 32 mmol/L Final   Glucose, Bld 10/26/2023 103 (H)  70 - 99 mg/dL Final   Glucose reference range applies only to samples taken after fasting for at least 8 hours.   BUN 10/26/2023 10  6 - 20 mg/dL Final   Creatinine, Ser 10/26/2023 1.00  0.61 - 1.24 mg/dL Final   Calcium 95/62/1308 9.0  8.9 - 10.3 mg/dL Final   Total Protein 65/78/4696 7.2  6.5 - 8.1 g/dL Final   Albumin 29/52/8413 3.0 (L)  3.5 - 5.0 g/dL Final   AST 24/40/1027 31  15 - 41 U/L Final   ALT 10/26/2023 39  0 - 44 U/L Final   Alkaline Phosphatase 10/26/2023 105  38 - 126 U/L Final   Total Bilirubin 10/26/2023 0.4  0.3 - 1.2 mg/dL Final   GFR, Estimated 10/26/2023 >60  >60 mL/min Final   Comment: (NOTE) Calculated using the CKD-EPI Creatinine Equation (2021)    Anion gap 10/26/2023 8  5 - 15 Final   Performed at [facility], [address] [place], [place] [address]   Alcohol, Ethyl (B) 10/26/2023 <10  <10 mg/dL Final   Comment: (NOTE) Lowest  detectable limit for serum alcohol is 10 mg/dL.  For medical purposes only. Performed at [facility], [address], [place], [place]  [address]    TSH 10/26/2023 1.130  0.350 - 4.500 uIU/mL Final   Comment: Performed by a 3rd Generation assay with a functional sensitivity of <=0.01 uIU/mL. Performed at [facility], [address] [place], [place] [address]    POC Amphetamine UR 10/25/2023 None Detected  NONE DETECTED (Cut Off Level 1000 ng/mL) Final   POC Secobarbital (BAR) 10/25/2023 None Detected  NONE DETECTED (Cut Off Level 300 ng/mL) Final   POC Buprenorphine (BUP) 10/25/2023 None Detected  NONE DETECTED (Cut Off Level 10 ng/mL) Final   POC Oxazepam (BZO) 10/25/2023 None Detected  NONE DETECTED (Cut Off Level 300 ng/mL) Final   POC Cocaine UR 10/25/2023 None Detected  NONE DETECTED (Cut Off Level 300 ng/mL) Final   POC Methamphetamine UR 10/25/2023 Positive (A)  NONE DETECTED (Cut Off Level 1000 ng/mL) Final   POC Morphine 10/25/2023 None Detected  NONE DETECTED (Cut Off Level 300 ng/mL) Final   POC Methadone UR 10/25/2023 None Detected  NONE DETECTED (Cut Off Level 300 ng/mL) Final   POC Oxycodone UR 10/25/2023 None Detected  NONE DETECTED (Cut Off Level 100 ng/mL) Final   POC Marijuana UR 10/25/2023 None Detected  NONE DETECTED (Cut Off Level 50 ng/mL) Final  Admission on 06/29/2023, Discharged on 06/29/2023  Component Date Value Ref Range Status   WBC 06/28/2023 5.3  4.0 - 10.5 K/uL Final   RBC 06/28/2023 5.44  4.22 - 5.81 MIL/uL Final   Hemoglobin 06/28/2023 14.2  13.0 - 17.0 g/dL Final   HCT 09/81/1914 42.0  39.0 - 52.0 % Final   MCV 06/28/2023 77.2 (L)  80.0 - 100.0 fL Final   MCH 06/28/2023 26.1  26.0 - 34.0 pg Final   MCHC 06/28/2023 33.8  30.0 - 36.0 g/dL Final   RDW 78/29/5621 14.3  11.5 - 15.5 % Final   Platelets 06/28/2023 341  150 - 400 K/uL Final   nRBC 06/28/2023 0.0  0.0 - 0.2 % Final   Performed at [facility], [address], [place], [place] [address]   Sodium 06/28/2023 143  135 - 145 mmol/L Final   Potassium 06/28/2023 3.9  3.5 - 5.1 mmol/L Final   Chloride 06/28/2023 106  98 - 111 mmol/L Final   CO2 06/28/2023 28  22 - 32 mmol/L Final   Glucose, Bld 06/28/2023 93  70 - 99 mg/dL Final   Glucose reference range applies only to samples taken after fasting for at least 8 hours.   BUN 06/28/2023 21 (H)  6 - 20 mg/dL Final   Creatinine, Ser 06/28/2023 1.18  0.61 - 1.24 mg/dL Final   Calcium 78/46/9629 9.8  8.9 - 10.3 mg/dL Final   GFR, Estimated 06/28/2023 >60  >60 mL/min Final   Comment: (NOTE) Calculated using the CKD-EPI Creatinine Equation (2021)    Anion gap 06/28/2023 9  5 - 15 Final   Performed at [facility], [address], [place], [place] [address]   Total CK 06/28/2023 333  49 - 397 U/L Final   Performed at [facility], [address] [place], [place] [address]  Admission on 05/08/2023, Discharged on 05/08/2023  Component Date Value Ref Range Status   Sodium 05/08/2023 142  135 - 145 mmol/L Final   Potassium 05/08/2023 3.7  3.5 - 5.1 mmol/L Final   Chloride 05/08/2023 104  98 - 111 mmol/L Final   CO2 05/08/2023 28  22 - 32 mmol/L Final   Glucose, Bld 05/08/2023 102 (H)  70 -  99 mg/dL Final   Glucose reference range applies only to samples taken after fasting for at least 8 hours.   BUN 05/08/2023 18  6 - 20 mg/dL Final   Creatinine, Ser 05/08/2023 0.96  0.61 - 1.24 mg/dL Final   Calcium 23/76/2831 9.3  8.9 - 10.3 mg/dL Final   Total Protein 51/76/1607 7.5  6.5 - 8.1 g/dL Final   Albumin 37/09/6268 4.4  3.5 - 5.0 g/dL Final   AST 48/54/6270 22  15 - 41 U/L Final   ALT 05/08/2023 27  0 - 44 U/L Final   Alkaline Phosphatase 05/08/2023 120  38 - 126 U/L Final   Total Bilirubin 05/08/2023 0.5  0.3 - 1.2 mg/dL Final   GFR, Estimated 05/08/2023 >60  >60 mL/min Final   Comment: (NOTE) Calculated using the CKD-EPI Creatinine Equation (2021)    Anion gap  05/08/2023 10  5 - 15 Final   Performed at [facility], [address], [place], [place] [address]   WBC 05/08/2023 5.8  4.0 - 10.5 K/uL Final   RBC 05/08/2023 5.89 (H)  4.22 - 5.81 MIL/uL Final   Hemoglobin 05/08/2023 15.0  13.0 - 17.0 g/dL Final   HCT 38/18/2993 45.5  39.0 - 52.0 % Final   MCV 05/08/2023 77.2 (L)  80.0 - 100.0 fL Final   MCH 05/08/2023 25.5 (L)  26.0 - 34.0 pg Final   MCHC 05/08/2023 33.0  30.0 - 36.0 g/dL Final   RDW 71/69/6789 16.8 (H)  11.5 - 15.5 % Final   Platelets 05/08/2023 267  150 - 400 K/uL Final   nRBC 05/08/2023 0.0  0.0 - 0.2 % Final   Neutrophils Relative % 05/08/2023 62  % Final   Neutro Abs 05/08/2023 3.5  1.7 - 7.7 K/uL Final   Lymphocytes Relative 05/08/2023 27  % Final   Lymphs Abs 05/08/2023 1.6  0.7 - 4.0 K/uL Final   Monocytes Relative 05/08/2023 11  % Final   Monocytes Absolute 05/08/2023 0.7  0.1 - 1.0 K/uL Final   Eosinophils Relative 05/08/2023 0  % Final   Eosinophils Absolute 05/08/2023 0.0  0.0 - 0.5 K/uL Final   Basophils Relative 05/08/2023 0  % Final   Basophils Absolute 05/08/2023 0.0  0.0 - 0.1 K/uL Final   Immature Granulocytes 05/08/2023 0  % Final   Abs Immature Granulocytes 05/08/2023 0.01  0.00 - 0.07 K/uL Final   Performed at [facility], [address] [place], [place] [address]   Specimen Description 05/08/2023    Final                   Value:BLOOD BLOOD RIGHT ARM Performed at [facility], [address] [place], [place] [address]    Special Requests 05/08/2023    Final                   Value:BOTTLES DRAWN AEROBIC AND ANAEROBIC Blood Culture adequate volume Performed at [facility], [address] [place], [place] [address]    Culture 05/08/2023    Final                   Value:NO GROWTH 5 DAYS Performed at [facility], [address], [place], [place] [address]    Report Status 05/08/2023 05/13/2023 FINAL   Final   Lactic  Acid, Venous 05/08/2023 1.3  0.5 - 1.9 mmol/L Final   Performed at [facility], [address], [place],  [place] [address]    Blood Alcohol level:  Lab Results  Component Value Date   ETH <10 10/26/2023   ETH <10 06/03/2021    Metabolic Disorder Labs: No results found for: "HGBA1C", "MPG" No results found for: "PROLACTIN" No results found for: "CHOL", "TRIG", "HDL", "CHOLHDL", "VLDL", "LDLCALC"  Therapeutic Lab Levels: No results found for: "LITHIUM" No results found for: "VALPROATE" No results found for: "CBMZ"  Physical Findings   Flowsheet Row ED from 10/25/2023 in [facility] ED from 09/13/2023 in [facility] at [facility] ED from 06/29/2023 in [facility] Department at [facility]  C-SSRS RISK CATEGORY No Risk No Risk No Risk        Musculoskeletal  Strength & Muscle Tone: {desc; muscle tone:32375} Gait & Station: {PE GAIT ED XLKG:40102} Patient leans: {Patient Leans:[id number]}  Psychiatric Specialty Exam  Presentation  General Appearance:  Bizarre  Eye Contact: Fair  Speech: Clear and Coherent  Speech Volume: Normal  Handedness:No data recorded  Mood and Affect  Mood: Anxious  Affect: Congruent   Thought Process  Thought Processes: Coherent  Descriptions of Associations:Intact  Orientation:Full (Time, Place and Person)  Thought Content:WDL  Diagnosis of Schizophrenia or Schizoaffective disorder in past: No    Hallucinations:Hallucinations: None  Ideas of Reference:None  Suicidal Thoughts:Suicidal Thoughts: No  Homicidal Thoughts:Homicidal Thoughts: No   Sensorium  Memory: Immediate Fair  Judgment: Poor  Insight: Poor   Executive Functions  Concentration: Fair  Attention Span: Fair  Recall: Fair  Fund of Knowledge: Fair  Language: Fair   Psychomotor Activity  Psychomotor Activity: Psychomotor Activity:  Normal   Assets  Assets: Desire for Improvement; Resilience   Sleep  Sleep: Sleep: Fair Number of Hours of Sleep: 6   Nutritional Assessment (For OBS and FBC admissions only) Has the patient had a weight loss or gain of 10 pounds or more in the last 3 months?: No Has the patient had a decrease in food intake/or appetite?: No Does the patient have dental problems?: No Does the patient have eating habits or behaviors that may be indicators of an eating disorder including binging or inducing vomiting?: No Has the patient recently lost weight without trying?: 0 Has the patient been eating poorly because of a decreased appetite?: 0 Malnutrition Screening Tool Score: 0    Physical Exam  Physical Exam ROS Blood pressure 101/76, pulse 90, temperature 98.3 F (36.8 C), temperature source Oral, resp. rate 19, SpO2 98%. There is no height or weight on file to calculate BMI.  Treatment Plan Summary: {CHL [facility] MD [place]. VOZD:[id number]}  Joaquin Courts, NP 10/26/2023 3:49 PM

## 2023-10-26 NOTE — ED Notes (Signed)
Patient observed/assessed in bed/chair resting quietly appearing in no distress and verbalizing no complaints at this time. Will continue to monitor.  

## 2023-10-26 NOTE — ED Provider Notes (Signed)
FBC/OBS ASAP Discharge Summary  Date and Time: 10/26/2023 9:34 PM  Name: Brad Evans  MRN:  [id number]   Discharge Diagnoses:  Final diagnoses:  Opioid abuse (HCC)  Recurrent major depressive disorder, remission status unspecified (HCC)  Homelessness unspecified  Cellulitis of left arm    Subjective: Brad Evans, 27 y/o male with a history of opioid abuse (fentanyl),  was admitted 10/25/23.  Per the patient he was looking detox become his parents wanted him too.   See at admission noteds.   Stay Summary: pt alert and oriented x 4,  denies SI,  HI,  AVH.  Pt requested to be discharge, pt didn't want to wait for providers,  and sign out AMA.  According to nursing staff pt does not appear to be rist to self or others.    Total Time spent with patient: 15 minutes  Past Psychiatric History: opioid abuse,  homelessness, MDD Past Medical History: see chart  Family History: unknown  Family Psychiatric History: unknown  Social History: alcohol, fentanyl abuse Tobacco Cessation:  N/A, patient does not currently use tobacco products  Current Medications:  Current Facility-Administered Medications  Medication Dose Route Frequency Provider Last Rate Last Admin   acetaminophen (TYLENOL) tablet 650 mg  650 mg Oral Q6H PRN Sindy Guadeloupe, NP   650 mg at 10/26/23 1308   alum & mag hydroxide-simeth (MAALOX/MYLANTA) 200-200-20 MG/5ML suspension 30 mL  30 mL Oral Q4H PRN Sindy Guadeloupe, NP       dicyclomine (BENTYL) tablet 20 mg  20 mg Oral Q6H PRN Sindy Guadeloupe, NP       hydrOXYzine (ATARAX) tablet 25 mg  25 mg Oral Q6H PRN Sindy Guadeloupe, NP       loperamide (IMODIUM) capsule 2-4 mg  2-4 mg Oral PRN Sindy Guadeloupe, NP       magnesium hydroxide (MILK OF MAGNESIA) suspension 30 mL  30 mL Oral Daily PRN Sindy Guadeloupe, NP       methocarbamol (ROBAXIN) tablet 500 mg  500 mg Oral Q8H PRN Sindy Guadeloupe, NP       naproxen (NAPROSYN) tablet 500 mg  500 mg Oral BID PRN Sindy Guadeloupe, NP       OLANZapine  zydis (ZYPREXA) disintegrating tablet 5 mg  5 mg Oral Q8H PRN Sindy Guadeloupe, NP       And   ziprasidone (GEODON) injection 20 mg  20 mg Intramuscular PRN Sindy Guadeloupe, NP       ondansetron (ZOFRAN-ODT) disintegrating tablet 4 mg  4 mg Oral Q6H PRN Sindy Guadeloupe, NP       sulfamethoxazole-trimethoprim (BACTRIM DS) 800-160 MG per tablet 1 tablet  1 tablet Oral Q12H Bing Neighbors, NP   1 tablet at 10/26/23 1621   Current Outpatient Medications  Medication Sig Dispense Refill   albuterol (PROAIR HFA) 108 (90 Base) MCG/ACT inhaler Inhale 2 puffs into the lungs every 4 (four) hours as needed for wheezing. (Patient not taking: Reported on 10/26/2023)     buprenorphine-naloxone (SUBOXONE) 8-2 mg SUBL SL tablet Place 1 tablet under the tongue 2 (two) times daily. (Patient not taking: Reported on 10/26/2023)     cloNIDine (CATAPRES) 0.1 MG tablet Take 0.1 mg by mouth 2 (two) times daily as needed. (Patient not taking: Reported on 10/26/2023)     fluticasone (FLOVENT HFA) 44 MCG/ACT inhaler Inhale 2 puffs into the lungs as needed. (Patient not taking: Reported on 10/26/2023)     gabapentin (NEURONTIN) 300 MG capsule Take 300 mg by mouth  3 (three) times daily as needed. (Patient not taking: Reported on 10/26/2023)     ibuprofen (ADVIL) 600 MG tablet Take 1 tablet (600 mg total) by mouth every 6 (six) hours as needed. (Patient not taking: Reported on 10/26/2023) 30 tablet 0   sertraline (ZOLOFT) 50 MG tablet Take 50 mg by mouth every morning. (Patient not taking: Reported on 10/26/2023)  2    PTA Medications:  PTA Medications  Medication Sig   sertraline (ZOLOFT) 50 MG tablet Take 50 mg by mouth every morning. (Patient not taking: Reported on 10/26/2023)   ibuprofen (ADVIL) 600 MG tablet Take 1 tablet (600 mg total) by mouth every 6 (six) hours as needed. (Patient not taking: Reported on 10/26/2023)   albuterol (PROAIR HFA) 108 (90 Base) MCG/ACT inhaler Inhale 2 puffs into the lungs every 4 (four)  hours as needed for wheezing. (Patient not taking: Reported on 10/26/2023)   buprenorphine-naloxone (SUBOXONE) 8-2 mg SUBL SL tablet Place 1 tablet under the tongue 2 (two) times daily. (Patient not taking: Reported on 10/26/2023)   gabapentin (NEURONTIN) 300 MG capsule Take 300 mg by mouth 3 (three) times daily as needed. (Patient not taking: Reported on 10/26/2023)   fluticasone (FLOVENT HFA) 44 MCG/ACT inhaler Inhale 2 puffs into the lungs as needed. (Patient not taking: Reported on 10/26/2023)   cloNIDine (CATAPRES) 0.1 MG tablet Take 0.1 mg by mouth 2 (two) times daily as needed. (Patient not taking: Reported on 10/26/2023)   Facility Ordered Medications  Medication   acetaminophen (TYLENOL) tablet 650 mg   alum & mag hydroxide-simeth (MAALOX/MYLANTA) 200-200-20 MG/5ML suspension 30 mL   magnesium hydroxide (MILK OF MAGNESIA) suspension 30 mL   dicyclomine (BENTYL) tablet 20 mg   hydrOXYzine (ATARAX) tablet 25 mg   loperamide (IMODIUM) capsule 2-4 mg   methocarbamol (ROBAXIN) tablet 500 mg   naproxen (NAPROSYN) tablet 500 mg   ondansetron (ZOFRAN-ODT) disintegrating tablet 4 mg   OLANZapine zydis (ZYPREXA) disintegrating tablet 5 mg   And   ziprasidone (GEODON) injection 20 mg   sulfamethoxazole-trimethoprim (BACTRIM DS) 800-160 MG per tablet 1 tablet        No data to display          Flowsheet Row ED from 10/25/2023 in [facility] ED from 09/13/2023 in [facility] at [facility] ED from 06/29/2023 in [facility] Emergency Department at [facility]  C-SSRS RISK CATEGORY No Risk No Risk No Risk       Musculoskeletal  Strength & Muscle Tone: within normal limits Gait & Station: normal Patient leans: N/A  Psychiatric Specialty Exam  Presentation  General Appearance:  Bizarre  Eye Contact: Fair  Speech: Clear and Coherent  Speech Volume: Normal  Handedness:No data recorded  Mood and Affect   Mood: Anxious  Affect: Congruent   Thought Process  Thought Processes: Coherent  Descriptions of Associations:Intact  Orientation:Full (Time, Place and Person)  Thought Content:WDL  Diagnosis of Schizophrenia or Schizoaffective disorder in past: No    Hallucinations:Hallucinations: None  Ideas of Reference:None  Suicidal Thoughts:Suicidal Thoughts: No  Homicidal Thoughts:Homicidal Thoughts: No   Sensorium  Memory: Immediate Fair  Judgment: Poor  Insight: Poor   Executive Functions  Concentration: Fair  Attention Span: Fair  Recall: Fair  Fund of Knowledge: Fair  Language: Fair   Psychomotor Activity  Psychomotor Activity: Psychomotor Activity: Normal   Assets  Assets: Desire for Improvement; Resilience   Sleep  Sleep: Sleep: Fair Number of Hours of Sleep: 6  Nutritional Assessment (For OBS and FBC admissions only) Has the patient had a weight loss or gain of 10 pounds or more in the last 3 months?: No Has the patient had a decrease in food intake/or appetite?: No Does the patient have dental problems?: No Does the patient have eating habits or behaviors that may be indicators of an eating disorder including binging or inducing vomiting?: No Has the patient recently lost weight without trying?: 0 Has the patient been eating poorly because of a decreased appetite?: 0 Malnutrition Screening Tool Score: 0    Physical Exam  Physical Exam HENT:     Head: Normocephalic.     Nose: Nose normal.  Eyes:     Pupils: Pupils are equal, round, and reactive to light.  Cardiovascular:     Rate and Rhythm: Normal rate.  Pulmonary:     Effort: Pulmonary effort is normal.  Musculoskeletal:        General: Normal range of motion.     Cervical back: Normal range of motion.  Neurological:     General: No focal deficit present.     Mental Status: He is alert.  Psychiatric:        Mood and Affect: Mood normal.        Behavior: Behavior  normal.        Thought Content: Thought content normal.    Review of Systems  Constitutional: Negative.   HENT: Negative.    Eyes: Negative.   Respiratory: Negative.    Cardiovascular: Negative.   Gastrointestinal: Negative.   Genitourinary: Negative.   Musculoskeletal: Negative.   Skin: Negative.   Neurological: Negative.   Psychiatric/Behavioral:  Positive for substance abuse. The patient is nervous/anxious.    Blood pressure 134/75, pulse 84, temperature 98.3 F (36.8 C), temperature source Oral, resp. rate 18, SpO2 100%. There is no height or weight on file to calculate BMI.  Demographic Factors:  Male, Unemployed, and homelessness  Loss Factors: NA  Historical Factors: Prior suicide attempts and Impulsivity  Risk Reduction Factors:   NA  Continued Clinical Symptoms:  Depression:   Hopelessness  Cognitive Features That Contribute To Risk:  None    Suicide Risk:  Minimal: No identifiable suicidal ideation.  Patients presenting with no risk factors but with morbid ruminations; may be classified as minimal risk based on the severity of the depressive symptoms  Plan Of Care/Follow-up recommendations:  Activity:  pt to f/u with outpatient service  Disposition: pt left AMA   Sindy Guadeloupe, NP 10/26/2023, 9:34 PM

## 2023-10-26 NOTE — ED Notes (Signed)
Patient was provided Gatorade. Drank 3/4 of the bottle.

## 2023-10-26 NOTE — ED Notes (Signed)
Patient was given soup and ginger ale.

## 2023-11-27 ENCOUNTER — Inpatient Hospital Stay (HOSPITAL_BASED_OUTPATIENT_CLINIC_OR_DEPARTMENT_OTHER)
Admission: EM | Admit: 2023-11-27 | Discharge: 2023-11-30 | DRG: 871 | Disposition: A | Discharge status: Home / Self Care | Payer: MEDICAID | Attending: Internal Medicine | Admitting: Internal Medicine

## 2023-11-27 ENCOUNTER — Emergency Department (HOSPITAL_BASED_OUTPATIENT_CLINIC_OR_DEPARTMENT_OTHER): Payer: MEDICAID

## 2023-11-27 ENCOUNTER — Other Ambulatory Visit: Payer: Self-pay

## 2023-11-27 ENCOUNTER — Encounter (HOSPITAL_BASED_OUTPATIENT_CLINIC_OR_DEPARTMENT_OTHER): Payer: Self-pay | Admitting: Emergency Medicine

## 2023-11-27 DIAGNOSIS — R109 Unspecified abdominal pain: Secondary | ICD-10-CM | POA: Diagnosis present

## 2023-11-27 DIAGNOSIS — A419 Sepsis, unspecified organism: Principal | ICD-10-CM | POA: Diagnosis present

## 2023-11-27 DIAGNOSIS — J189 Pneumonia, unspecified organism: Secondary | ICD-10-CM | POA: Diagnosis present

## 2023-11-27 DIAGNOSIS — E8729 Other acidosis: Secondary | ICD-10-CM | POA: Diagnosis present

## 2023-11-27 DIAGNOSIS — F3181 Bipolar II disorder: Secondary | ICD-10-CM | POA: Diagnosis present

## 2023-11-27 DIAGNOSIS — G4700 Insomnia, unspecified: Secondary | ICD-10-CM | POA: Diagnosis present

## 2023-11-27 DIAGNOSIS — J9602 Acute respiratory failure with hypercapnia: Secondary | ICD-10-CM | POA: Diagnosis present

## 2023-11-27 DIAGNOSIS — F321 Major depressive disorder, single episode, moderate: Secondary | ICD-10-CM | POA: Insufficient documentation

## 2023-11-27 DIAGNOSIS — R45851 Suicidal ideations: Secondary | ICD-10-CM | POA: Diagnosis present

## 2023-11-27 DIAGNOSIS — F1910 Other psychoactive substance abuse, uncomplicated: Secondary | ICD-10-CM

## 2023-11-27 DIAGNOSIS — K5900 Constipation, unspecified: Secondary | ICD-10-CM | POA: Diagnosis present

## 2023-11-27 DIAGNOSIS — F1121 Opioid dependence, in remission: Secondary | ICD-10-CM | POA: Diagnosis not present

## 2023-11-27 DIAGNOSIS — B1920 Unspecified viral hepatitis C without hepatic coma: Secondary | ICD-10-CM | POA: Diagnosis present

## 2023-11-27 DIAGNOSIS — F17210 Nicotine dependence, cigarettes, uncomplicated: Secondary | ICD-10-CM | POA: Diagnosis present

## 2023-11-27 DIAGNOSIS — Z85841 Personal history of malignant neoplasm of brain: Secondary | ICD-10-CM

## 2023-11-27 DIAGNOSIS — Z818 Family history of other mental and behavioral disorders: Secondary | ICD-10-CM

## 2023-11-27 DIAGNOSIS — J90 Pleural effusion, not elsewhere classified: Secondary | ICD-10-CM | POA: Diagnosis present

## 2023-11-27 DIAGNOSIS — F411 Generalized anxiety disorder: Secondary | ICD-10-CM | POA: Diagnosis present

## 2023-11-27 DIAGNOSIS — Z9221 Personal history of antineoplastic chemotherapy: Secondary | ICD-10-CM

## 2023-11-27 DIAGNOSIS — Z1152 Encounter for screening for COVID-19: Secondary | ICD-10-CM | POA: Diagnosis not present

## 2023-11-27 DIAGNOSIS — E860 Dehydration: Secondary | ICD-10-CM | POA: Diagnosis present

## 2023-11-27 DIAGNOSIS — J45909 Unspecified asthma, uncomplicated: Secondary | ICD-10-CM | POA: Diagnosis present

## 2023-11-27 DIAGNOSIS — R6520 Severe sepsis without septic shock: Secondary | ICD-10-CM | POA: Diagnosis present

## 2023-11-27 DIAGNOSIS — E871 Hypo-osmolality and hyponatremia: Secondary | ICD-10-CM | POA: Diagnosis present

## 2023-11-27 DIAGNOSIS — J9601 Acute respiratory failure with hypoxia: Secondary | ICD-10-CM | POA: Diagnosis present

## 2023-11-27 DIAGNOSIS — R509 Fever, unspecified: Secondary | ICD-10-CM | POA: Diagnosis not present

## 2023-11-27 LAB — I-STAT ARTERIAL BLOOD GAS, ED
Acid-Base Excess: 1 mmol/L (ref 0.0–2.0)
Acid-Base Excess: 3 mmol/L — ABNORMAL HIGH (ref 0.0–2.0)
Bicarbonate: 28 mmol/L (ref 20.0–28.0)
Bicarbonate: 29.4 mmol/L — ABNORMAL HIGH (ref 20.0–28.0)
Calcium, Ion: 1.19 mmol/L (ref 1.15–1.40)
Calcium, Ion: 1.19 mmol/L (ref 1.15–1.40)
HCT: 32 % — ABNORMAL LOW (ref 39.0–52.0)
HCT: 32 % — ABNORMAL LOW (ref 39.0–52.0)
Hemoglobin: 10.9 g/dL — ABNORMAL LOW (ref 13.0–17.0)
Hemoglobin: 10.9 g/dL — ABNORMAL LOW (ref 13.0–17.0)
O2 Saturation: 97 %
O2 Saturation: 99 %
Patient temperature: 102.1
Patient temperature: 100.4
Potassium: 4.5 mmol/L (ref 3.5–5.1)
Potassium: 4.6 mmol/L (ref 3.5–5.1)
Sodium: 131 mmol/L — ABNORMAL LOW (ref 135–145)
Sodium: 131 mmol/L — ABNORMAL LOW (ref 135–145)
TCO2: 30 mmol/L (ref 22–32)
TCO2: 31 mmol/L (ref 22–32)
pCO2 arterial: 58.7 mm[Hg] — ABNORMAL HIGH (ref 32–48)
pCO2 arterial: 54.3 mm[Hg] — ABNORMAL HIGH (ref 32–48)
pH, Arterial: 7.297 — ABNORMAL LOW (ref 7.35–7.45)
pH, Arterial: 7.345 — ABNORMAL LOW (ref 7.35–7.45)
pO2, Arterial: 107 mm[Hg] (ref 83–108)
pO2, Arterial: 131 mm[Hg] — ABNORMAL HIGH (ref 83–108)

## 2023-11-27 LAB — COMPREHENSIVE METABOLIC PANEL
ALT: 98 U/L — ABNORMAL HIGH (ref 0–44)
AST: 98 U/L — ABNORMAL HIGH (ref 15–41)
Albumin: 3.7 g/dL (ref 3.5–5.0)
Alkaline Phosphatase: 190 U/L — ABNORMAL HIGH (ref 38–126)
Anion gap: 8 (ref 5–15)
BUN: 21 mg/dL — ABNORMAL HIGH (ref 6–20)
CO2: 31 mmol/L (ref 22–32)
Calcium: 9.8 mg/dL (ref 8.9–10.3)
Chloride: 91 mmol/L — ABNORMAL LOW (ref 98–111)
Creatinine, Ser: 1.09 mg/dL (ref 0.61–1.24)
GFR, Estimated: >60 mL/min (ref >60)
Glucose, Bld: 114 mg/dL — ABNORMAL HIGH (ref 70–99)
Potassium: 4.7 mmol/L (ref 3.5–5.1)
Sodium: 130 mmol/L — ABNORMAL LOW (ref 135–145)
Total Bilirubin: 1.5 mg/dL — ABNORMAL HIGH (ref <1.2)
Total Protein: 9.1 g/dL — ABNORMAL HIGH (ref 6.5–8.1)

## 2023-11-27 LAB — URINALYSIS, W/ REFLEX TO CULTURE (INFECTION SUSPECTED)
Bacteria, UA: NONE SEEN
Bilirubin Urine: NEGATIVE
Glucose, UA: NEGATIVE mg/dL
Hgb urine dipstick: NEGATIVE
Ketones, ur: NEGATIVE mg/dL
Leukocytes,Ua: NEGATIVE
Nitrite: NEGATIVE
Protein, ur: 30 mg/dL — AB
Specific Gravity, Urine: 1.032 — ABNORMAL HIGH (ref 1.005–1.030)
pH: 5.5 (ref 5.0–8.0)

## 2023-11-27 LAB — PROTIME-INR
INR: 1.2 (ref 0.8–1.2)
Prothrombin Time: 15.9 s — ABNORMAL HIGH (ref 11.4–15.2)

## 2023-11-27 LAB — CBC WITH DIFFERENTIAL/PLATELET
Abs Immature Granulocytes: 0.13 10*3/uL — ABNORMAL HIGH (ref 0.00–0.07)
Basophils Absolute: 0 10*3/uL (ref 0.0–0.1)
Basophils Relative: 0 %
Eosinophils Absolute: 0 10*3/uL (ref 0.0–0.5)
Eosinophils Relative: 0 %
HCT: 39.8 % (ref 39.0–52.0)
Hemoglobin: 13 g/dL (ref 13.0–17.0)
Immature Granulocytes: 1 %
Lymphocytes Relative: 10 %
Lymphs Abs: 2 10*3/uL (ref 0.7–4.0)
MCH: 27 pg (ref 26.0–34.0)
MCHC: 32.7 g/dL (ref 30.0–36.0)
MCV: 82.7 fL (ref 80.0–100.0)
Monocytes Absolute: 1.4 10*3/uL — ABNORMAL HIGH (ref 0.1–1.0)
Monocytes Relative: 7 %
Neutro Abs: 15.7 10*3/uL — ABNORMAL HIGH (ref 1.7–7.7)
Neutrophils Relative %: 82 %
Platelets: 363 10*3/uL (ref 150–400)
RBC: 4.81 MIL/uL (ref 4.22–5.81)
RDW: 14.4 % (ref 11.5–15.5)
WBC: 19.3 10*3/uL — ABNORMAL HIGH (ref 4.0–10.5)
nRBC: 0 % (ref 0.0–0.2)

## 2023-11-27 LAB — LIPASE, BLOOD: Lipase: 13 U/L (ref 11–51)

## 2023-11-27 LAB — MAGNESIUM: Magnesium: 1.2 mg/dL — ABNORMAL LOW (ref 1.7–2.4)

## 2023-11-27 LAB — LACTIC ACID, PLASMA
Lactic Acid, Venous: 1 mmol/L (ref 0.5–1.9)
Lactic Acid, Venous: 6 mmol/L (ref 0.5–1.9)
Lactic Acid, Venous: 1.2 mmol/L (ref 0.5–1.9)

## 2023-11-27 LAB — TROPONIN I (HIGH SENSITIVITY)
Troponin I (High Sensitivity): 4 ng/L (ref <18)
Troponin I (High Sensitivity): 3 ng/L (ref <18)

## 2023-11-27 LAB — RESP PANEL BY RT-PCR (RSV, FLU A&B, COVID)  RVPGX2
Influenza A by PCR: NEGATIVE
Influenza B by PCR: NEGATIVE
Resp Syncytial Virus by PCR: NEGATIVE
SARS Coronavirus 2 by RT PCR: NEGATIVE

## 2023-11-27 MED ORDER — VANCOMYCIN HCL IN DEXTROSE 1-5 GM/200ML-% IV SOLN
1000.0000 mg | Freq: Two times a day (BID) | INTRAVENOUS | Status: DC
  Administered 2023-11-28 – 2023-11-29 (×3): 1000 mg via INTRAVENOUS
  Filled 2023-11-27 (×2): qty 200

## 2023-11-27 MED ORDER — DOCUSATE SODIUM 100 MG PO CAPS
100.0000 mg | ORAL_CAPSULE | Freq: Two times a day (BID) | ORAL | Status: DC
  Administered 2023-11-28: 100 mg via ORAL
  Filled 2023-11-27 (×2): qty 1

## 2023-11-27 MED ORDER — SODIUM CHLORIDE 0.9 % IV SOLN
2.0000 g | Freq: Once | INTRAVENOUS | Status: AC
  Administered 2023-11-27: 2 g via INTRAVENOUS
  Filled 2023-11-27: qty 12.5

## 2023-11-27 MED ORDER — LACTATED RINGERS IV BOLUS (SEPSIS)
1000.0000 mL | Freq: Once | INTRAVENOUS | Status: AC
  Administered 2023-11-27: 1000 mL via INTRAVENOUS

## 2023-11-27 MED ORDER — POLYETHYLENE GLYCOL 3350 17 G PO PACK: 17.0000 g | PACK | Freq: Every day | ORAL | Status: DC

## 2023-11-27 MED ORDER — METRONIDAZOLE 500 MG/100ML IV SOLN
500.0000 mg | Freq: Once | INTRAVENOUS | Status: AC
  Administered 2023-11-27: 500 mg via INTRAVENOUS
  Filled 2023-11-27: qty 100

## 2023-11-27 MED ORDER — SODIUM CHLORIDE 0.9 % IV SOLN
2.0000 g | Freq: Every day | INTRAVENOUS | Status: DC
  Administered 2023-11-28: 2 g via INTRAVENOUS
  Filled 2023-11-27: qty 20

## 2023-11-27 MED ORDER — VANCOMYCIN HCL IN DEXTROSE 1-5 GM/200ML-% IV SOLN: 1000.0000 mg | Freq: Once | INTRAVENOUS | Status: DC

## 2023-11-27 MED ORDER — ONDANSETRON HCL 4 MG/2ML IJ SOLN
4.0000 mg | Freq: Once | INTRAMUSCULAR | Status: AC
  Administered 2023-11-27: 4 mg via INTRAVENOUS
  Filled 2023-11-27: qty 2

## 2023-11-27 MED ORDER — MORPHINE SULFATE (PF) 4 MG/ML IV SOLN
4.0000 mg | Freq: Once | INTRAVENOUS | Status: AC
  Administered 2023-11-27: 4 mg via INTRAVENOUS
  Filled 2023-11-27: qty 1

## 2023-11-27 MED ORDER — ACETAMINOPHEN 500 MG PO TABS
1000.0000 mg | ORAL_TABLET | Freq: Once | ORAL | Status: AC
  Administered 2023-11-27: 1000 mg via ORAL
  Filled 2023-11-27: qty 2

## 2023-11-27 MED ORDER — IPRATROPIUM-ALBUTEROL 0.5-2.5 (3) MG/3ML IN SOLN
3.0000 mL | Freq: Four times a day (QID) | RESPIRATORY_TRACT | Status: DC
  Administered 2023-11-28 (×3): 3 mL via RESPIRATORY_TRACT
  Filled 2023-11-27 (×3): qty 3

## 2023-11-27 MED ORDER — IOHEXOL 350 MG/ML SOLN
100.0000 mL | Freq: Once | INTRAVENOUS | Status: AC | PRN
  Administered 2023-11-27: 100 mL via INTRAVENOUS

## 2023-11-27 MED ORDER — SODIUM CHLORIDE 0.9 % IV SOLN
500.0000 mg | INTRAVENOUS | Status: AC
  Administered 2023-11-28 – 2023-11-29 (×3): 500 mg via INTRAVENOUS
  Filled 2023-11-27 (×4): qty 5

## 2023-11-27 MED ORDER — VANCOMYCIN HCL 500 MG IV SOLR
500.0000 mg | Freq: Once | INTRAVENOUS | Status: AC
  Administered 2023-11-27: 500 mg via INTRAVENOUS

## 2023-11-27 MED ORDER — VANCOMYCIN HCL IN DEXTROSE 1-5 GM/200ML-% IV SOLN
1000.0000 mg | Freq: Once | INTRAVENOUS | Status: AC
  Administered 2023-11-27: 1000 mg via INTRAVENOUS
  Filled 2023-11-27: qty 200

## 2023-11-27 MED ORDER — BUDESONIDE 0.5 MG/2ML IN SUSP: 0.5000 mg | Freq: Two times a day (BID) | RESPIRATORY_TRACT | Status: DC

## 2023-11-27 MED ORDER — LACTATED RINGERS IV SOLN: INTRAVENOUS | Status: AC

## 2023-11-27 MED ORDER — LACTATED RINGERS IV BOLUS (SEPSIS)
250.0000 mL | Freq: Once | INTRAVENOUS | Status: AC
  Administered 2023-11-27: 250 mL via INTRAVENOUS

## 2023-11-27 NOTE — ED Notes (Signed)
RT obtained Post BP placement ABG on pt w/the following results.   Latest Reference Range & Units Most Recent  Sample type  ARTERIAL 11/27/23 21:27  pH, Arterial 7.35 - 7.45  7.345 (L) 11/27/23 21:27  pCO2 arterial 32 - 48 mmHg 54.3 (H) 11/27/23 21:27  pO2, Arterial 83 - 108 mmHg 131 (H) 11/27/23 21:27  TCO2 22 - 32 mmol/L 31 11/27/23 21:27  Acid-Base Excess 0.0 - 2.0 mmol/L 3.0 (H) 11/27/23 21:27  Bicarbonate 20.0 - 28.0 mmol/L 29.4 (H) 11/27/23 21:27  O2 Saturation % 99 11/27/23 21:27  Patient temperature  100.4 F 11/27/23 21:27  Collection site  RADIAL, ALLEN'S TEST ACCEPTABLE 11/27/23 21:27    11/27/23 2125  Therapy Vitals  Pulse Rate 74  Resp (!) 22  BP 99/87  Patient Position (if appropriate) Sitting  MEWS Score/Color  MEWS Score 2  MEWS Score Color Yellow  Respiratory Assessment  Assessment Type Assess only  Respiratory Pattern Regular;Labored;Dyspnea at rest;Symmetrical;Tachypnea  Chest Assessment Chest expansion symmetrical  Cough None  Sputum Amount None  Bilateral Breath Sounds Clear;Diminished  R Upper  Breath Sounds Clear  L Upper Breath Sounds Clear  R Lower Breath Sounds Diminished  L Lower Breath Sounds Diminished  Oxygen Therapy/Pulse Ox  O2 Device Bi-PAP  O2 Therapy Oxygen  FiO2 (%) 40 %  SpO2 100 %

## 2023-11-27 NOTE — ED Notes (Signed)
RT obtained ABG on pt while on simple mask 6 lpm (44%) w/the following results. MD notified.   Latest Reference Range & Units Most Recent  Sample type  ARTERIAL 11/27/23 19:38  pH, Arterial 7.35 - 7.45  7.297 (L) 11/27/23 19:38  pCO2 arterial 32 - 48 mmHg 58.7 (H) 11/27/23 19:38  pO2, Arterial 83 - 108 mmHg 107 11/27/23 19:38  TCO2 22 - 32 mmol/L 30 11/27/23 19:38  Acid-Base Excess 0.0 - 2.0 mmol/L 1.0 11/27/23 19:38  Bicarbonate 20.0 - 28.0 mmol/L 28.0 11/27/23 19:38  O2 Saturation % 97 11/27/23 19:38  Patient temperature  102.1 F 11/27/23 19:38  Collection site  RADIAL, ALLEN'S TEST ACCEPTABLE 11/27/23 19:38    11/27/23 1930  Vitals  Pulse Rate 88  ECG Heart Rate 89  Resp (!) 31  BP 130/74  SpO2 99 %  Oxygen Therapy  O2 Device (S)  Simple Mask (pt congested and sats mid 80's)  O2 Flow Rate (L/min) (S)  6 L/min  FiO2 (%) (S)  44 %  Patient Activity (if Appropriate) In bed  Pulse Oximetry Type Continuous

## 2023-11-27 NOTE — ED Notes (Signed)
RT placed pt on BIPAP/Servo per MD order. Pt tolerating well at this time. RT will continue to monitor while at [facility].    11/27/23 2045  BiPAP/CPAP/SIPAP  $ Non-Invasive Ventilator  Non-Invasive Vent Set Up;Non-Invasive Vent Initial  $ Face Mask Medium Yes  BiPAP/CPAP/SIPAP Pt Type Adult  BiPAP/CPAP/SIPAP SERVO  Mask Type Full face mask  Mask Size Medium  Set Rate 16 breaths/min  Respiratory Rate 36 breaths/min  IPAP 10 cmH20  EPAP 5 cmH2O  PEEP 5 cmH20  FiO2 (%) (S)  40 %  Minute Ventilation 14.4  Leak 32  Peak Inspiratory Pressure (PIP) 15  Tidal Volume (Vt) 378  Press High Alarm 35 cmH2O  BiPAP/CPAP /SiPAP Vitals  Bilateral Breath Sounds Clear;Diminished

## 2023-11-27 NOTE — Progress Notes (Signed)
Pharmacy Antibiotic Note  Brad Evans is a 27 y.o. male admitted on 11/27/2023 with sepsis.  Pharmacy has been consulted for Cefepime and Vancomycin dosing.  WBC 19.3, Tmax 102.1, RR 24 Scr   Received Cefepime 2g IV x1, Metronidazole 500mg  IV x1, and Vancomycin 1500mg  total in ED  Plan: Cefepime 2g IV q8h Vancomycin 1000mg  IV x 1, followed by vancomycin 500mg  IV x1 for a total loading dose of vancomycin 1500mg  IV, then  Vancomycin 1000mg  IV q12h (eAUC ~477)    > Goal AUC 400-550    > Check vancomycin levels at steady state  Monitor daily CBC, temp, SCr, and for clinical signs of improvement  F/u cultures and de-escalate antibiotics as able   Weight: 68 kg (150 lb)  Temp (24hrs), Avg:102.1 F (38.9 C), Min:102.1 F (38.9 C), Max:102.1 F (38.9 C)  Recent Labs  Lab 11/27/23 1830  WBC 19.3*  CREATININE 1.09  LATICACIDVEN 1.0    Estimated Creatinine Clearance: 97.9 mL/min (by C-G formula based on SCr of 1.09 mg/dL).    No Known Allergies  Antimicrobials this admission: Cefepime 11/30 >>  Vancomycin 11/30 >>  Metronidazole 11/30 >>   Dose adjustments this admission: N/A  Microbiology results: 11/30 BCx x2: sent  Thank you for allowing pharmacy to be a part of this patient's care.  Wilburn Cornelia, PharmD, BCPS Clinical Pharmacist 11/27/2023 7:15 PM   Please refer to [facility] for pharmacy phone number

## 2023-11-27 NOTE — ED Notes (Signed)
Dr. Earlene Plater aware of lactic of 6.0. Request a redraw.

## 2023-11-27 NOTE — ED Triage Notes (Signed)
Iv drug user, last used 2 days ago (opiates). Pt is on methadone

## 2023-11-27 NOTE — ED Triage Notes (Signed)
Pt has been coughing for 4 weeks. For 2 days he has had lower back and pelvic pain. He states his urine is dark despite drinking water.

## 2023-11-27 NOTE — H&P (Incomplete)
NAME:  Brad Evans, MRN:  [id number], DOB:  1996/09/24, LOS: 0 ADMISSION DATE:  11/27/2023, CONSULTATION DATE:  11/30 REFERRING MD:  Dr. Earlene Plater , CHIEF COMPLAINT:  Acute respiratory failure   History of Present Illness:  Patient is a 27 year old male with pertinent PMH brain tumor removed 5 years ago followed by date, bipolar 2 disorder, IV drug use presents to [facility] ED on 11/30 with abdominal pain.  Patient states he has been having lower abdominal pain over the past few days but is significantly worsening and now radiating to the right lower back.  Patient also having a cough, SOB, chest pain described as pleuritic pain, fever, chills.  Patient states he used IV drugs a couple days ago.  Patient denies headache or neurologic changes.  Came to [facility] ED on 11/30 for further eval.  On arrival patient ill-appearing and febrile 102.1 F.  Patient have abdominal tenderness.  Cultures obtained, given IV fluids, started on broad-spectrum antibiotics.  CTA chest/abdomen/pelvis with no PE, RLL consolidation; trace right pleural effusion; bilateral hilar lymphadenopathy; splenomegaly; increased stool burden in cecum and ascending colon likely constipation.  UA clear.  LA 1 then 6.  WBC 19.3.  Lipase 13.  Trops flat.  COVID/flu negative.  ABG 7.29, 58, 107, 28.  Patient placed on BiPAP with some improvement ABG.  PCCM consulted for ICU admission.  Pertinent ED labs: NA 130 AST 98 ALT 98 alk phos 190  Pertinent  Medical History   Brain tumor removed 5 years ago; followed by [facility] IV drug use on suboxone Bipolar 2 Homelessness   Significant Hospital Events: Including procedures, antibiotic start and stop dates in addition to other pertinent events   11/30 admitted with abdominal pain/SOB; placed on BiPAP  Interim History / Subjective:  ***  Objective   Blood pressure 130/78, pulse 89, temperature (!) 100.4 F (38 C), temperature source Oral, resp. rate (!) 29, weight 68 kg, SpO2 100%.     FiO2 (%):  [40 %-44 %] 40 % PEEP:  [5 cmH20] 5 cmH20   Intake/Output Summary (Last 24 hours) at 11/27/2023 2127 Last data filed at 11/27/2023 2105 Gross per 24 hour  Intake 2350 ml  Output 200 ml  Net 2150 ml   Filed Weights   11/27/23 1825  Weight: 68 kg    Examination: General:   ill appearing male on bipap HEENT: MM pink/moist; ETT in place Neuro: Aox3; MAE; sedated; CV: s1s2, RRR ***, no m/r/g PULM:  dim clear BS bilaterally; on bipap GI: soft, bsx4 active  Extremities: warm/dry, *** edema  Skin: no rashes or lesions    Resolved Hospital Problem list     Assessment & Plan:    Sepsis: Likely secondary to pneumonia; given history of IV drug use Plan: -Continue rocephin/vancomycin; will add azithromycin for atypical coverage -IV fluids; trend LA -Follow cultures -Check echo -Check RVP, urine Legionella/strep, MRSA PCR, PCT -trend wbc/fever curve  Acute respiratory failure with hypercapnia RLL consolidation likely pneumonia Hx of asthma Plan: -Continue BiPAP -Wean FiO2 for sats greater than 92% -Pulm toiletry -Antibiotics as above; follow cultures -cont scheduled duoneb and pulmicort  Hyponatremia Plan: -Likely dehydration -Continue IV fluids -Trend BMP  Constipation Plan: -bowel regimen  Elevated LFTs Plan: -Hepatitis panel  IV drug use on suboxone Bipolar 2 Plan: -Hold home suboxone, clonidine, gabapentin, and sertraline while npo on bipap; consider resuming in a.m.  Brain tumor removed 5 years ago; followed by [facility] outpt Plan: -f/u outpt w/ [facility]  Best Practice (right click and "Reselect all SmartList Selections" daily)   Diet/type: NPO w/ oral meds DVT prophylaxis:    Pressure ulcer(s): not present on admission  GI prophylaxis: N/A Lines: N/A Foley:  N/A Code Status:  full code Last date of multidisciplinary goals of care discussion [***]  Labs   CBC: Recent Labs  Lab 11/27/23 1830 11/27/23 1938  WBC 19.3*  --    NEUTROABS 15.7*  --   HGB 13.0 10.9*  HCT 39.8 32.0*  MCV 82.7  --   PLT 363  --     Basic Metabolic Panel: Recent Labs  Lab 11/27/23 1830 11/27/23 1938  NA 130* 131*  K 4.7 4.5  CL 91*  --   CO2 31  --   GLUCOSE 114*  --   BUN 21*  --   CREATININE 1.09  --   CALCIUM 9.8  --    GFR: Estimated Creatinine Clearance: 97.9 mL/min (by C-G formula based on SCr of 1.09 mg/dL). Recent Labs  Lab 11/27/23 1830  WBC 19.3*  LATICACIDVEN 1.0    Liver Function Tests: Recent Labs  Lab 11/27/23 1830  AST 98*  ALT 98*  ALKPHOS 190*  BILITOT 1.5*  PROT 9.1*  ALBUMIN 3.7   Recent Labs  Lab 11/27/23 1830  LIPASE 13   No results for input(s): "AMMONIA" in the last 168 hours.  ABG    Component Value Date/Time   PHART 7.297 (L) 11/27/2023 1938   PCO2ART 58.7 (H) 11/27/2023 1938   PO2ART 107 11/27/2023 1938   HCO3 28.0 11/27/2023 1938   TCO2 30 11/27/2023 1938   O2SAT 97 11/27/2023 1938     Coagulation Profile: Recent Labs  Lab 11/27/23 1830  INR 1.2    Cardiac Enzymes: No results for input(s): "CKTOTAL", "CKMB", "CKMBINDEX", "TROPONINI" in the last 168 hours.  HbA1C: No results found for: "HGBA1C"  CBG: No results for input(s): "GLUCAP" in the last 168 hours.  Review of Systems:   Patient is sob on bipap; therefore, history has been obtained from chart review.    Past Medical History:  He,  has a past medical history of Bipolar 2 disorder (HCC), Brain tumor (HCC), and Social anxiety disorder.   Surgical History:  History reviewed. No pertinent surgical history.   Social History:   reports that he has been smoking. He does not have any smokeless tobacco history on file. He reports that he does not currently use alcohol. He reports that he does not currently use drugs. Frequency: 7.00 times per week.   Family History:  His family history is not on file.   Allergies No Known Allergies   Home Medications  Prior to Admission medications    Medication Sig Start Date End Date Taking? Authorizing Provider  albuterol (PROAIR HFA) 108 (90 Base) MCG/ACT inhaler Inhale 2 puffs into the lungs every 4 (four) hours as needed for wheezing. Patient not taking: Reported on 10/26/2023    [provider]  buprenorphine-naloxone (SUBOXONE) 8-2 mg SUBL SL tablet Place 1 tablet under the tongue 2 (two) times daily. Patient not taking: Reported on 10/26/2023 06/30/23   [provider]  cloNIDine (CATAPRES) 0.1 MG tablet Take 0.1 mg by mouth 2 (two) times daily as needed. Patient not taking: Reported on 10/26/2023 05/19/23   [provider]  fluticasone (FLOVENT HFA) 44 MCG/ACT inhaler Inhale 2 puffs into the lungs as needed. Patient not taking: Reported on 10/26/2023    [provider]  gabapentin (NEURONTIN) 300  MG capsule Take 300 mg by mouth 3 (three) times daily as needed. Patient not taking: Reported on 10/26/2023 05/19/23   [provider]  ibuprofen (ADVIL) 600 MG tablet Take 1 tablet (600 mg total) by mouth every 6 (six) hours as needed. Patient not taking: Reported on 10/26/2023 05/08/23   Small, Brooke L, PA  sertraline (ZOLOFT) 50 MG tablet Take 50 mg by mouth every morning. Patient not taking: Reported on 10/26/2023 11/24/14   [provider]     Critical care time: 45 minutes     JD Daryel [facility] 11/27/2023, 9:27 PM  Please see [email or web address] for pager details.  From 7A-7P if no response, please call [phone]. After hours, please call ELink [phone].

## 2023-11-27 NOTE — ED Provider Notes (Signed)
[place] [facility] AT [facility] Provider Note   CSN: [id number] Arrival date & time: 11/27/23  1803     History  Chief Complaint  Patient presents with   Back Pain   Code Sepsis    DOMANIC BOENDER is a 27 y.o. male.   Back Pain 27 year old male history of brain tumor, bipolar 2 disorder, IV drug use presenting for abdominal pain, chest pain, shortness of breath.  Patient states for the last 2 to 3 days he has had significant pain in his lower abdomen and now right lower back.  He has cough, generalized chest pain as well.  Some pleuritic pain.  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  He has had fever and chills.  No headache or neurologic changes.  No midline back pain.  No radicular pain or saddle anesthesia or weakness or numbness.     Home Medications Prior to Admission medications   Medication Sig Start Date End Date Taking? Authorizing Provider  albuterol (PROAIR HFA) 108 (90 Base) MCG/ACT inhaler Inhale 2 puffs into the lungs every 4 (four) hours as needed for wheezing. Patient not taking: Reported on 10/26/2023    [provider]  buprenorphine-naloxone (SUBOXONE) 8-2 mg SUBL SL tablet Place 1 tablet under the tongue 2 (two) times daily. Patient not taking: Reported on 10/26/2023 06/30/23   [provider]  cloNIDine (CATAPRES) 0.1 MG tablet Take 0.1 mg by mouth 2 (two) times daily as needed. Patient not taking: Reported on 10/26/2023 05/19/23   [provider]  fluticasone (FLOVENT HFA) 44 MCG/ACT inhaler Inhale 2 puffs into the lungs as needed. Patient not taking: Reported on 10/26/2023    [provider]  gabapentin (NEURONTIN) 300 MG capsule Take 300 mg by mouth 3 (three) times daily as needed. Patient not taking: Reported on 10/26/2023 05/19/23   [provider]  ibuprofen (ADVIL) 600 MG tablet Take 1 tablet (600 mg total) by mouth every 6 (six) hours as  needed. Patient not taking: Reported on 10/26/2023 05/08/23   Small, Brooke L, PA  sertraline (ZOLOFT) 50 MG tablet Take 50 mg by mouth every morning. Patient not taking: Reported on 10/26/2023 11/24/14   [provider]      Allergies    Patient has no known allergies.    Review of Systems   Review of Systems  Musculoskeletal:  Positive for back pain.  Review of systems completed and notable as per HPI.  ROS otherwise negative.   Physical Exam Updated Vital Signs BP 118/81   Pulse 70   Temp 99.8 F (37.7 C) (Axillary)   Resp (!) 21   Wt 68 kg   SpO2 100%   BMI 20.34 kg/m  Physical Exam Vitals and nursing note reviewed.  Constitutional:      Appearance: He is well-developed. He is ill-appearing.  HENT:     Head: Normocephalic and atraumatic.     Mouth/Throat:     Mouth: Mucous membranes are dry.     Pharynx: Oropharynx is clear.  Eyes:     Extraocular Movements: Extraocular movements intact.     Conjunctiva/sclera: Conjunctivae normal.     Pupils: Pupils are equal, round, and reactive to light.  Cardiovascular:     Rate and Rhythm: Regular rhythm. Tachycardia present.     Pulses: Normal pulses.     Heart sounds: Normal heart sounds. No murmur heard. Pulmonary:     Effort: Pulmonary effort  is normal. No respiratory distress.     Breath sounds: Rhonchi present.     Comments: Rhonchi in the left upper lung fields. Abdominal:     Palpations: Abdomen is soft.     Tenderness: There is abdominal tenderness. There is no right CVA tenderness, left CVA tenderness, guarding or rebound.  Musculoskeletal:        General: No swelling.     Cervical back: Normal range of motion and neck supple. No rigidity or tenderness.     Right lower leg: No edema.     Left lower leg: No edema.     Comments: No midline spinal tenderness.  No meningismus.  Skin:    General: Skin is warm and dry.     Capillary Refill: Capillary refill takes less than 2 seconds.  Neurological:      General: No focal deficit present.     Mental Status: He is alert and oriented to person, place, and time. Mental status is at baseline.  Psychiatric:        Mood and Affect: Mood normal.     ED Results / Procedures / Treatments   Labs (all labs ordered are listed, but only abnormal results are displayed) Labs Reviewed  COMPREHENSIVE METABOLIC PANEL - Abnormal; Notable for the following components:      Result Value   Sodium 130 (*)    Chloride 91 (*)    Glucose, Bld 114 (*)    BUN 21 (*)    Total Protein 9.1 (*)    AST 98 (*)    ALT 98 (*)    Alkaline Phosphatase 190 (*)    Total Bilirubin 1.5 (*)    All other components within normal limits  LACTIC ACID, PLASMA - Abnormal; Notable for the following components:   Lactic Acid, Venous 6.0 (*)    All other components within normal limits  CBC WITH DIFFERENTIAL/PLATELET - Abnormal; Notable for the following components:   WBC 19.3 (*)    Neutro Abs 15.7 (*)    Monocytes Absolute 1.4 (*)    Abs Immature Granulocytes 0.13 (*)    All other components within normal limits  PROTIME-INR - Abnormal; Notable for the following components:   Prothrombin Time 15.9 (*)    All other components within normal limits  URINALYSIS, W/ REFLEX TO CULTURE (INFECTION SUSPECTED) - Abnormal; Notable for the following components:   Specific Gravity, Urine 1.032 (*)    Protein, ur 30 (*)    All other components within normal limits  MAGNESIUM - Abnormal; Notable for the following components:   Magnesium 1.2 (*)    All other components within normal limits  I-STAT ARTERIAL BLOOD GAS, ED - Abnormal; Notable for the following components:   pH, Arterial 7.297 (*)    pCO2 arterial 58.7 (*)    Sodium 131 (*)    HCT 32.0 (*)    Hemoglobin 10.9 (*)    All other components within normal limits  I-STAT ARTERIAL BLOOD GAS, ED - Abnormal; Notable for the following components:   pH, Arterial 7.345 (*)    pCO2 arterial 54.3 (*)    pO2, Arterial 131 (*)     Bicarbonate 29.4 (*)    Acid-Base Excess 3.0 (*)    Sodium 131 (*)    HCT 32.0 (*)    Hemoglobin 10.9 (*)    All other components within normal limits  CULTURE, BLOOD (ROUTINE X 2)  RESP PANEL BY RT-PCR (RSV, FLU A&B, COVID)  RVPGX2  CULTURE,  BLOOD (ROUTINE X 2)  MRSA NEXT GEN BY PCR, NASAL  CULTURE, RESPIRATORY W GRAM STAIN  RESPIRATORY PANEL BY PCR  EXPECTORATED SPUTUM ASSESSMENT W GRAM STAIN, RFLX TO RESP C  LACTIC ACID, PLASMA  LIPASE, BLOOD  LACTIC ACID, PLASMA  LEGIONELLA PNEUMOPHILA SEROGP 1 UR AG  STREP PNEUMONIAE URINARY ANTIGEN  HEPATITIS PANEL, ACUTE  PROCALCITONIN  TROPONIN I (HIGH SENSITIVITY)  TROPONIN I (HIGH SENSITIVITY)    EKG EKG Interpretation Date/Time:  Saturday November 27 2023 18:48:25 EST Ventricular Rate:  87 PR Interval:  124 QRS Duration:  86 QT Interval:  344 QTC Calculation: 414 R Axis:   55  Text Interpretation: Sinus rhythm Borderline repolarization abnormality Confirmed by Fulton Reek [phone]) on 11/27/2023 6:49:47 PM  Radiology CT ABDOMEN PELVIS W CONTRAST  Result Date: 11/27/2023 CLINICAL DATA:  Cough x 2 weeks, lower back pain and dark urine. Abdominal pain, acute, nonlocalized Fever, abdominal and chest pain, hx IVDU EXAM: CT ANGIOGRAPHY CHEST CT ABDOMEN AND PELVIS WITH CONTRAST TECHNIQUE: Multidetector CT imaging of the chest was performed using the standard protocol during bolus administration of intravenous contrast. Multiplanar CT image reconstructions and MIPs were obtained to evaluate the vascular anatomy. Multidetector CT imaging of the abdomen and pelvis was performed using the standard protocol during bolus administration of intravenous contrast. RADIATION DOSE REDUCTION: This exam was performed according to the departmental dose-optimization program which includes automated exposure control, adjustment of the mA and/or kV according to patient size and/or use of iterative reconstruction technique. CONTRAST:  OMNIPAQUE  IOHEXOL 350 MG/ML SOLN COMPARISON:  None Available. FINDINGS: CTA CHEST FINDINGS Cardiovascular: Satisfactory opacification of the pulmonary arteries to the segmental level. No evidence of pulmonary embolism. Normal heart size. No significant pericardial effusion. The thoracic aorta is normal in caliber. No atherosclerotic plaque of the thoracic aorta. No coronary artery calcifications. Mediastinum/Nodes: Likely residual thymus within the anterior mediastinum. Enlarged right hilar lymph node measuring 1.6 cm. Enlarged left hilar lymph node measuring 1.2 cm. No mediastinal lymphadenopathy. No axillary lymphadenopathy. Thyroid gland, trachea, and esophagus demonstrate no significant findings. Lungs/Pleura: Heterogeneous right lower lobe consolidation. Some passive atelectasis of the right lower lobe. No pulmonary nodule. No pulmonary mass. Loculated trace to small volume right pleural effusion. Pneumothorax. Musculoskeletal: Bilateral gynecomastia. No suspicious lytic or blastic osseous lesions. No acute displaced fracture. Multilevel degenerative changes of the spine. Review of the MIP images confirms the above findings. CT ABDOMEN and PELVIS FINDINGS Hepatobiliary: No focal liver abnormality. No gallstones, gallbladder wall thickening, or pericholecystic fluid. No biliary dilatation. Pancreas: No focal lesion. Normal pancreatic contour. No surrounding inflammatory changes. No main pancreatic ductal dilatation. Spleen: Spleen is enlarged measuring up to 17 cm. Adrenals/Urinary Tract: No adrenal nodule bilaterally. Bilateral kidneys enhance symmetrically. No hydronephrosis. No hydroureter. The urinary bladder is unremarkable. Stomach/Bowel: Stomach is within normal limits. Increased stool burden throughout the cecum and ascending colon/hepatic flexure. No evidence of bowel wall thickening or dilatation. Appendix appears normal. Vascular/Lymphatic: No abdominal aorta or iliac aneurysm. No abdominal, pelvic, or inguinal  lymphadenopathy. Reproductive: Prostate is unremarkable. Other: No intraperitoneal free fluid. No intraperitoneal free gas. No organized fluid collection. Musculoskeletal: No abdominal wall hernia or abnormality. No suspicious lytic or blastic osseous lesions. No acute displaced fracture. Review of the MIP images confirms the above findings. IMPRESSION: 1. No pulmonary embolus. 2. Heterogeneous right lower lobe consolidation suggestive of infection with no definite abscess formation. 3. Loculated trace to small volume right pleural effusion. 4. Bilateral hilar lymphadenopathy may be reactive in etiology.  5. Splenomegaly. Finding could be due to lymphoproliferative disorder. 6. Increased stool burden throughout the cecum and ascending colon. Correlate constipation. Electronically Signed   By: Tish Frederickson M.D.   On: 11/27/2023 20:42   CT Angio Chest PE W/Cm &/Or Wo Cm  Result Date: 11/27/2023 CLINICAL DATA:  Cough x 2 weeks, lower back pain and dark urine. Abdominal pain, acute, nonlocalized Fever, abdominal and chest pain, hx IVDU EXAM: CT ANGIOGRAPHY CHEST CT ABDOMEN AND PELVIS WITH CONTRAST TECHNIQUE: Multidetector CT imaging of the chest was performed using the standard protocol during bolus administration of intravenous contrast. Multiplanar CT image reconstructions and MIPs were obtained to evaluate the vascular anatomy. Multidetector CT imaging of the abdomen and pelvis was performed using the standard protocol during bolus administration of intravenous contrast. RADIATION DOSE REDUCTION: This exam was performed according to the departmental dose-optimization program which includes automated exposure control, adjustment of the mA and/or kV according to patient size and/or use of iterative reconstruction technique. CONTRAST:  OMNIPAQUE IOHEXOL 350 MG/ML SOLN COMPARISON:  None Available. FINDINGS: CTA CHEST FINDINGS Cardiovascular: Satisfactory opacification of the pulmonary arteries to the  segmental level. No evidence of pulmonary embolism. Normal heart size. No significant pericardial effusion. The thoracic aorta is normal in caliber. No atherosclerotic plaque of the thoracic aorta. No coronary artery calcifications. Mediastinum/Nodes: Likely residual thymus within the anterior mediastinum. Enlarged right hilar lymph node measuring 1.6 cm. Enlarged left hilar lymph node measuring 1.2 cm. No mediastinal lymphadenopathy. No axillary lymphadenopathy. Thyroid gland, trachea, and esophagus demonstrate no significant findings. Lungs/Pleura: Heterogeneous right lower lobe consolidation. Some passive atelectasis of the right lower lobe. No pulmonary nodule. No pulmonary mass. Loculated trace to small volume right pleural effusion. Pneumothorax. Musculoskeletal: Bilateral gynecomastia. No suspicious lytic or blastic osseous lesions. No acute displaced fracture. Multilevel degenerative changes of the spine. Review of the MIP images confirms the above findings. CT ABDOMEN and PELVIS FINDINGS Hepatobiliary: No focal liver abnormality. No gallstones, gallbladder wall thickening, or pericholecystic fluid. No biliary dilatation. Pancreas: No focal lesion. Normal pancreatic contour. No surrounding inflammatory changes. No main pancreatic ductal dilatation. Spleen: Spleen is enlarged measuring up to 17 cm. Adrenals/Urinary Tract: No adrenal nodule bilaterally. Bilateral kidneys enhance symmetrically. No hydronephrosis. No hydroureter. The urinary bladder is unremarkable. Stomach/Bowel: Stomach is within normal limits. Increased stool burden throughout the cecum and ascending colon/hepatic flexure. No evidence of bowel wall thickening or dilatation. Appendix appears normal. Vascular/Lymphatic: No abdominal aorta or iliac aneurysm. No abdominal, pelvic, or inguinal lymphadenopathy. Reproductive: Prostate is unremarkable. Other: No intraperitoneal free fluid. No intraperitoneal free gas. No organized fluid collection.  Musculoskeletal: No abdominal wall hernia or abnormality. No suspicious lytic or blastic osseous lesions. No acute displaced fracture. Review of the MIP images confirms the above findings. IMPRESSION: 1. No pulmonary embolus. 2. Heterogeneous right lower lobe consolidation suggestive of infection with no definite abscess formation. 3. Loculated trace to small volume right pleural effusion. 4. Bilateral hilar lymphadenopathy may be reactive in etiology. 5. Splenomegaly. Finding could be due to lymphoproliferative disorder. 6. Increased stool burden throughout the cecum and ascending colon. Correlate constipation. Electronically Signed   By: Tish Frederickson M.D.   On: 11/27/2023 20:42    Procedures Procedures    Medications Ordered in ED Medications  lactated ringers infusion ( Intravenous New Bag/Given 11/27/23 2044)  vancomycin (VANCOCIN) IVPB 1000 mg/200 mL premix (has no administration in time range)  azithromycin (ZITHROMAX) 500 mg in sodium chloride 0.9 % 250 mL IVPB (has no administration in  time range)  ipratropium-albuterol (DUONEB) 0.5-2.5 (3) MG/3ML nebulizer solution 3 mL (has no administration in time range)  budesonide (PULMICORT) nebulizer solution 0.5 mg (has no administration in time range)  polyethylene glycol (MIRALAX / GLYCOLAX) packet 17 g (has no administration in time range)  docusate sodium (COLACE) capsule 100 mg (has no administration in time range)  lactated ringers bolus 1,000 mL (0 mLs Intravenous Stopped 11/27/23 2048)    And  lactated ringers bolus 1,000 mL (0 mLs Intravenous Stopped 11/27/23 2048)    And  lactated ringers bolus 250 mL (0 mLs Intravenous Stopped 11/27/23 2105)  ceFEPIme (MAXIPIME) 2 g in sodium chloride 0.9 % 100 mL IVPB (0 g Intravenous Stopped 11/27/23 1957)  metroNIDAZOLE (FLAGYL) IVPB 500 mg (0 mg Intravenous Stopped 11/27/23 2130)  acetaminophen (TYLENOL) tablet 1,000 mg (1,000 mg Oral Given 11/27/23 1854)  vancomycin (VANCOCIN) IVPB 1000  mg/200 mL premix (1,000 mg Intravenous New Bag/Given 11/27/23 2130)    Followed by  vancomycin (VANCOCIN) 500 mg in sodium chloride 0.9 % 100 mL IVPB (500 mg Intravenous New Bag/Given 11/27/23 2241)  morphine (PF) 4 MG/ML injection 4 mg (4 mg Intravenous Given 11/27/23 1941)  iohexol (OMNIPAQUE) 350 MG/ML injection 100 mL (100 mLs Intravenous Contrast Given 11/27/23 1957)  ondansetron (ZOFRAN) injection 4 mg (4 mg Intravenous Given 11/27/23 2025)    ED Course/ Medical Decision Making/ A&P Clinical Course as of 11/27/23 2358  Sat Nov 27, 2023  2106 Discussed wth Dr. Lonzo Candy who accepts to [facility] ICU [JD]  2135 Blood gas is improving on BiPAP.  He is easily arousable retaining his airway.  I do not think needs to be elevated for transport. [JD]  2135 Lactic acid is elevated on repeat.  His first 1 was normal and his vital signs are stable.  I wonder if this may be artificially elevated due to it being drawn off the line with LR.  Will repeat.  Bed is available working on transfer. [JD]    Clinical Course User Index [JD] Laurence Spates, MD                                 Medical Decision Making Amount and/or Complexity of Data Reviewed Labs: ordered. Radiology: ordered.  Risk OTC drugs. Prescription drug management. Decision regarding hospitalization.   Medical Decision Making:   DAMASCUS BLOT is a 27 y.o. male who presented to the ED today with fever, Donnell pain, chest pain.  He arrives tachycardic, febrile, newly hypoxic on 2 L.  I am concern for possible bacteremia given history of IV drug use.  Plan obtain CTA to evaluate for PE and septic emboli as well as CT abdomen pelvis given tenderness.  Not peritonitic.  No midline spinal pain, lower suspicion for osteomyelitis or epidural abscess at this time.  No signs of meningitis.  Empiric antibiotics are ordered.   Patient placed on continuous vitals and telemetry monitoring while in ED which was reviewed periodically.   Reviewed and confirmed nursing documentation for past medical history, family history, social history.  Reassessment and Plan:   Labwork reviewed.  He is significantly cytosis, mild hyponatremia, elevated transaminitis possibly due to undiagnosed hepatitis.  He has hypomagnesemia.  Normal lactic acid.  I reviewed his CT scan, he has significant right lower lobe pneumonia.  No other acute intra-abdominal pathology.  I am concerned for bacteremia and sepsis.  His waveform on his oxygen was somewhat difficult  to read.  We obtained an ABG which confirmed oxygenation but showed mild respiratory acidosis from hypercapnia.  I do not hear any wheezing or indication for breathing treatments right now.  I suspect related to pneumonia and splinting from pain.  He was placed on BiPAP and is tolerating well.  Repeat blood gas shows improvement.  He initially had a repeat lactic acid of 6, this was redrawn and it was 1.2 I think this 6 was spurious.  He is maintaining his airway, alert, oriented texting in the room.  I do not think he needs in a patient for transfer.  I talked with Dr. Lonzo Candy who accepted for transfer and admission to the [facility] ICU.   Patient's presentation is most consistent with acute presentation with potential threat to life or bodily function.           Final Clinical Impression(s) / ED Diagnoses Final diagnoses:  Sepsis, due to unspecified organism, unspecified whether acute organ dysfunction present [facility])    Rx / DC Orders ED Discharge Orders     None         Laurence Spates, MD 11/27/23 2358

## 2023-11-27 NOTE — ED Notes (Signed)
Blood cultures x2 obtained before abx started. Only x1 culture able to be obtained from 2nd set.

## 2023-11-28 ENCOUNTER — Inpatient Hospital Stay (HOSPITAL_COMMUNITY): Payer: MEDICAID

## 2023-11-28 DIAGNOSIS — J9602 Acute respiratory failure with hypercapnia: Secondary | ICD-10-CM | POA: Diagnosis present

## 2023-11-28 DIAGNOSIS — A419 Sepsis, unspecified organism: Secondary | ICD-10-CM | POA: Diagnosis not present

## 2023-11-28 DIAGNOSIS — J189 Pneumonia, unspecified organism: Secondary | ICD-10-CM

## 2023-11-28 DIAGNOSIS — R509 Fever, unspecified: Secondary | ICD-10-CM

## 2023-11-28 LAB — ECHOCARDIOGRAM COMPLETE
AR max vel: 2.44 cm2
AV Area VTI: 2.16 cm2
AV Area mean vel: 2.1 cm2
AV Mean grad: 4 mm[Hg]
AV Peak grad: 7.7 mm[Hg]
Ao pk vel: 1.39 m/s
S' Lateral: 3.8 cm
Weight: 2624.36 [oz_av]

## 2023-11-28 LAB — CBC
HCT: 31.5 % — ABNORMAL LOW (ref 39.0–52.0)
Hemoglobin: 10 g/dL — ABNORMAL LOW (ref 13.0–17.0)
MCH: 25.8 pg — ABNORMAL LOW (ref 26.0–34.0)
MCHC: 31.7 g/dL (ref 30.0–36.0)
MCV: 81.2 fL (ref 80.0–100.0)
Platelets: 265 10*3/uL (ref 150–400)
RBC: 3.88 MIL/uL — ABNORMAL LOW (ref 4.22–5.81)
RDW: 14.2 % (ref 11.5–15.5)
WBC: 16.9 10*3/uL — ABNORMAL HIGH (ref 4.0–10.5)
nRBC: 0 % (ref 0.0–0.2)

## 2023-11-28 LAB — GLUCOSE, CAPILLARY: Glucose-Capillary: 153 mg/dL — ABNORMAL HIGH (ref 70–99)

## 2023-11-28 LAB — RAPID URINE DRUG SCREEN, HOSP PERFORMED
Amphetamines: NOT DETECTED
Barbiturates: NOT DETECTED
Benzodiazepines: NOT DETECTED
Cocaine: POSITIVE — AB
Opiates: POSITIVE — AB
Tetrahydrocannabinol: NOT DETECTED

## 2023-11-28 LAB — COMPREHENSIVE METABOLIC PANEL
ALT: 78 U/L — ABNORMAL HIGH (ref 0–44)
AST: 86 U/L — ABNORMAL HIGH (ref 15–41)
Albumin: 2.2 g/dL — ABNORMAL LOW (ref 3.5–5.0)
Alkaline Phosphatase: 168 U/L — ABNORMAL HIGH (ref 38–126)
Anion gap: 8 (ref 5–15)
BUN: 13 mg/dL (ref 6–20)
CO2: 27 mmol/L (ref 22–32)
Calcium: 8.3 mg/dL — ABNORMAL LOW (ref 8.9–10.3)
Chloride: 97 mmol/L — ABNORMAL LOW (ref 98–111)
Creatinine, Ser: 0.8 mg/dL (ref 0.61–1.24)
GFR, Estimated: >60 mL/min (ref >60)
Glucose, Bld: 134 mg/dL — ABNORMAL HIGH (ref 70–99)
Potassium: 5.7 mmol/L — ABNORMAL HIGH (ref 3.5–5.1)
Sodium: 132 mmol/L — ABNORMAL LOW (ref 135–145)
Total Bilirubin: 2 mg/dL — ABNORMAL HIGH (ref <1.2)
Total Protein: 6.6 g/dL (ref 6.5–8.1)

## 2023-11-28 LAB — VITAMIN B12: Vitamin B-12: 425 pg/mL (ref 180–914)

## 2023-11-28 LAB — FERRITIN: Ferritin: 161 ng/mL (ref 24–336)

## 2023-11-28 LAB — RESPIRATORY PANEL BY PCR

## 2023-11-28 LAB — HEPATITIS PANEL, ACUTE
HCV Ab: REACTIVE — AB
Hep A IgM: NONREACTIVE
Hep B C IgM: NONREACTIVE
Hepatitis B Surface Ag: NONREACTIVE

## 2023-11-28 LAB — IRON AND TIBC
Iron: 18 ug/dL — ABNORMAL LOW (ref 45–182)
Saturation Ratios: 9 % — ABNORMAL LOW (ref 17.9–39.5)
TIBC: 199 ug/dL — ABNORMAL LOW (ref 250–450)
UIBC: 181 ug/dL

## 2023-11-28 LAB — ETHANOL: Alcohol, Ethyl (B): <10 mg/dL (ref <10)

## 2023-11-28 LAB — STREP PNEUMONIAE URINARY ANTIGEN: Strep Pneumo Urinary Antigen: NEGATIVE

## 2023-11-28 LAB — HIV ANTIBODY (ROUTINE TESTING W REFLEX): HIV Screen 4th Generation wRfx: NONREACTIVE

## 2023-11-28 LAB — AMMONIA: Ammonia: 40 umol/L — ABNORMAL HIGH (ref 9–35)

## 2023-11-28 LAB — MRSA NEXT GEN BY PCR, NASAL: MRSA by PCR Next Gen: NOT DETECTED

## 2023-11-28 LAB — PROCALCITONIN: Procalcitonin: 1.03 ng/mL

## 2023-11-28 LAB — FOLATE: Folate: 7.8 ng/mL (ref >5.9)

## 2023-11-28 MED ORDER — MAGNESIUM SULFATE 50 % IJ SOLN: 6.0000 g | Freq: Once | INTRAVENOUS | Status: DC

## 2023-11-28 MED ORDER — MAGNESIUM SULFATE 4 GM/100ML IV SOLN
4.0000 g | Freq: Once | INTRAVENOUS | Status: AC
  Administered 2023-11-28: 4 g via INTRAVENOUS
  Filled 2023-11-28: qty 100

## 2023-11-28 MED ORDER — CHLORHEXIDINE GLUCONATE CLOTH 2 % EX PADS: 6.0000 | MEDICATED_PAD | Freq: Every day | CUTANEOUS | Status: DC

## 2023-11-28 MED ORDER — NICOTINE 14 MG/24HR TD PT24
14.0000 mg | MEDICATED_PATCH | Freq: Every day | TRANSDERMAL | Status: DC
  Administered 2023-11-28 – 2023-11-30 (×4): 14 mg via TRANSDERMAL
  Filled 2023-11-28 (×4): qty 1

## 2023-11-28 MED ORDER — AMOXICILLIN-POT CLAVULANATE 875-125 MG PO TABS: 1.0000 | ORAL_TABLET | Freq: Two times a day (BID) | ORAL | 0 refills | Status: DC

## 2023-11-28 MED ORDER — LACTATED RINGERS IV SOLN: INTRAVENOUS | Status: DC

## 2023-11-28 MED ORDER — DOCUSATE SODIUM 100 MG PO CAPS: 100.0000 mg | ORAL_CAPSULE | Freq: Two times a day (BID) | ORAL | Status: DC | PRN

## 2023-11-28 MED ORDER — ACETAMINOPHEN 325 MG PO TABS
650.0000 mg | ORAL_TABLET | ORAL | Status: DC | PRN
  Administered 2023-11-28 – 2023-11-29 (×5): 650 mg via ORAL
  Filled 2023-11-28 (×5): qty 2

## 2023-11-28 MED ORDER — ORAL CARE MOUTH RINSE: 15.0000 mL | OROMUCOSAL | Status: DC | PRN

## 2023-11-28 MED ORDER — CHLORHEXIDINE GLUCONATE CLOTH 2 % EX PADS
6.0000 | MEDICATED_PAD | Freq: Every day | CUTANEOUS | Status: DC
  Administered 2023-11-28: 6 via TOPICAL

## 2023-11-28 MED ORDER — MAGNESIUM SULFATE 4 GM/100ML IV SOLN: 4.0000 g | Freq: Once | INTRAVENOUS | Status: DC

## 2023-11-28 MED ORDER — AZITHROMYCIN 250 MG PO TABS: ORAL_TABLET | ORAL | 0 refills | Status: DC

## 2023-11-28 MED ORDER — SENNA 8.6 MG PO TABS
2.0000 | ORAL_TABLET | Freq: Every day | ORAL | Status: DC
  Administered 2023-11-29: 17.2 mg via ORAL
  Filled 2023-11-28 (×2): qty 2

## 2023-11-28 MED ORDER — LACTATED RINGERS IV BOLUS
1000.0000 mL | Freq: Once | INTRAVENOUS | Status: AC
  Administered 2023-11-28: 1000 mL via INTRAVENOUS

## 2023-11-28 MED ORDER — ONDANSETRON HCL 4 MG/2ML IJ SOLN
INTRAMUSCULAR | Status: AC
  Administered 2023-11-28: 4 mg
  Filled 2023-11-28: qty 2

## 2023-11-28 MED ORDER — BUPRENORPHINE HCL-NALOXONE HCL 8-2 MG SL SUBL
1.0000 | SUBLINGUAL_TABLET | Freq: Two times a day (BID) | SUBLINGUAL | Status: DC
  Administered 2023-11-29: 1 via SUBLINGUAL
  Filled 2023-11-28 (×4): qty 1

## 2023-11-28 MED ORDER — MAGNESIUM SULFATE 2 GM/50ML IV SOLN
2.0000 g | Freq: Once | INTRAVENOUS | Status: AC
Start: 2023-11-28 — End: 2023-11-28
  Administered 2023-11-28: 2 g via INTRAVENOUS
  Filled 2023-11-28: qty 50

## 2023-11-28 MED ORDER — PIPERACILLIN-TAZOBACTAM 3.375 G IVPB
3.3750 g | Freq: Three times a day (TID) | INTRAVENOUS | Status: DC
  Administered 2023-11-28 – 2023-11-30 (×7): 3.375 g via INTRAVENOUS
  Filled 2023-11-28 (×7): qty 50

## 2023-11-28 MED ORDER — TRAMADOL HCL 50 MG PO TABS
50.0000 mg | ORAL_TABLET | Freq: Four times a day (QID) | ORAL | Status: DC | PRN
  Administered 2023-11-28 – 2023-11-30 (×5): 50 mg via ORAL
  Filled 2023-11-28 (×5): qty 1

## 2023-11-28 MED ORDER — POLYETHYLENE GLYCOL 3350 17 G PO PACK: 17.0000 g | PACK | Freq: Every day | ORAL | Status: DC | PRN

## 2023-11-28 MED ORDER — ENOXAPARIN SODIUM 40 MG/0.4ML IJ SOSY
40.0000 mg | PREFILLED_SYRINGE | INTRAMUSCULAR | Status: DC
Start: 2023-11-28 — End: 2023-11-30
  Administered 2023-11-28 – 2023-11-30 (×3): 40 mg via SUBCUTANEOUS
  Filled 2023-11-28 (×4): qty 0.4

## 2023-11-28 MED ORDER — ONDANSETRON HCL 4 MG/2ML IJ SOLN
4.0000 mg | Freq: Four times a day (QID) | INTRAMUSCULAR | Status: DC | PRN
  Administered 2023-11-28 – 2023-11-30 (×3): 4 mg via INTRAVENOUS
  Filled 2023-11-28 (×3): qty 2

## 2023-11-28 NOTE — Progress Notes (Signed)
Pt's friend brought pt a black box this am to pts bedside. When this RN questioned pt about box pt refused to open the box. CRN involved and pt finally opened box. Inside the box needles discovered and secuirty called to help deescalate situation

## 2023-11-28 NOTE — Progress Notes (Signed)
Pt stable and ready for transport.

## 2023-11-28 NOTE — Progress Notes (Signed)
Echocardiogram 2D Echocardiogram has been performed.  Ayako Tapanes N Keziah Drotar,RDCS 11/28/2023, 11:54 AM

## 2023-11-28 NOTE — Progress Notes (Signed)
Pt now IVC, unit tech now in rm w/ pt

## 2023-11-28 NOTE — Progress Notes (Signed)
Pt has phone and watch with him. His other belonging are located in a patient belongings bag within the room.

## 2023-11-28 NOTE — Progress Notes (Signed)
eLink Physician-Brief Progress Note Patient Name: Brad Evans DOB: 21-Oct-1996 MRN: [id number]   Date of Service  11/28/2023  HPI/Events of Note    eICU Interventions     Camera in for new arrival RLL infiltrate Rt small loculated effusion Possible parapneumonic effusion ICU team at bedside   IVF Abx May need pleural fluid drainge       [name] 11/28/2023, 12:20 AM

## 2023-11-28 NOTE — Progress Notes (Signed)
Pts clothing sent home w/ mom & dad. Other personal belongings sent to security( follow personal chart for items)

## 2023-11-28 NOTE — Progress Notes (Signed)
NAME:  AJENE KRIEL, MRN:  [id number], DOB:  02/24/1996, LOS: 1 ADMISSION DATE:  11/27/2023, CONSULTATION DATE:  11/30 REFERRING MD:  Dr. Earlene Plater , CHIEF COMPLAINT:  Acute respiratory failure   History of Present Illness:  Patient is a 27 year old male with pertinent PMH brain tumor removed 5 years ago followed by [facility], bipolar 2 disorder, IV drug use presents to [facility] on 11/30 with abdominal pain.  Patient states he has been having lower abdominal pain over the past few days but is significantly worsening and now radiating to the right lower back.  Patient also having a cough, SOB, chest pain described as pleuritic pain, fever, chills.  Patient states he used IV drugs a couple days ago.  Patient denies headache or neurologic changes.  Came to [facility] ED on 11/30 for further eval.  On arrival patient ill-appearing and febrile 102.1 F.  Patient have abdominal tenderness.  Cultures obtained, given IV fluids, started on broad-spectrum antibiotics.  CTA chest/abdomen/pelvis with no PE, RLL consolidation; trace right pleural effusion; bilateral hilar lymphadenopathy; splenomegaly; increased stool burden in cecum and ascending colon likely constipation.  UA clear.  LA 1.  WBC 19.3.  Lipase 13.  Trops flat.  COVID/flu negative.  ABG 7.29, 58, 107, 28.  Patient placed on BiPAP with some improvement ABG.  PCCM consulted for ICU admission.  Pertinent ED labs: NA 130 AST 98 ALT 98 alk phos 190  Pertinent  Medical History   Brain tumor removed 5 years ago; followed by [facility] IV drug use on suboxone Bipolar 2 Homelessness  Significant Hospital Events: Including procedures, antibiotic start and stop dates in addition to other pertinent events   11/30 admitted with abdominal pain/SOB; placed on BiPAP  Interim History / Subjective:  He has been transitioned to nasal canula and breathing more comfortable He wanted to leave AMA but upon standing was in too much discomfort to leave so he decided to  stay   Objective   Blood pressure 119/71, pulse 62, temperature 97.7 F (36.5 C), temperature source Axillary, resp. rate (!) 25, weight 74.4 kg, SpO2 97%.    FiO2 (%):  [40 %-44 %] 40 % PEEP:  [5 cmH20] 5 cmH20   Intake/Output Summary (Last 24 hours) at 11/28/2023 0840 Last data filed at 11/28/2023 0700 Gross per 24 hour  Intake 3946.56 ml  Output 1075 ml  Net 2871.56 ml   Filed Weights   11/27/23 1825 11/28/23 0000 11/28/23 0315  Weight: 68 kg 74.4 kg 74.4 kg   Examination: General:   ill appearing male  HEENT: MM pink/moist; bipap mask in place Neuro: alert and oriented, moving all extremities CV: s1s2, RRR, no m/r/g PULM:  diminished air movement, no wheezing GI: soft, bsx4 active  Extremities: warm/dry, no edema; track marks on both arms and neck Skin: no rashes or lesions   Resolved Hospital Problem list     Assessment & Plan:   Sepsis due to RLL pneumonia Plan: -Start zozyn, continue vanc and azithro -Follow cultures -Check echo -Check RVP, urine Legionella/strep, MRSA PCR, PCT -trend wbc/fever curve  Acute respiratory failure with hypercapnia RLL consolidation likely pneumonia Small loculated right pleural effusion Hx of asthma Plan: -PRN BiPAP -Wean FiO2 for sats greater than 92% -Pulm toiletry -Antibiotics as above; follow cultures -cont scheduled duoneb and pulmicort - will monitor pleural effusion, too small for procedure at this time  Hyponatremia Hypomagnesemia Plan: -replete mag -Likely dehydration -Continue IV fluids -Trend BMP  Constipation Plan: -bowel regimen  Elevated  LFTs Plan: -Hepatitis panel -trend cmp  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 Bipolar 2 Plan: -Resume methadone - Resume sertraline - Hold clonidine, gabapentin  Brain tumor removed 5 years ago; followed by [facility] outpt Plan: -f/u outpt w/ [facility]  Tobacco abuse -smokes 1/2 pack for 11 years Plan: -nicotine patch -cessation counseling   Best Practice  (right click and "Reselect all SmartList Selections" daily)   Diet/type: Regular consistency (see orders) DVT prophylaxis: enoxaparin (LOVENOX) injection 40 mg Start: 11/28/23 1000   Pressure ulcer(s): not present on admission  GI prophylaxis: N/A Lines: N/A Foley:  N/A Code Status:  full code Last date of multidisciplinary goals of care discussion [12/1 updated patient at bedside]  Labs   CBC: Recent Labs  Lab 11/27/23 1830 11/27/23 1938 11/27/23 2127 11/28/23 0437  WBC 19.3*  --   --  16.9*  NEUTROABS 15.7*  --   --   --   HGB 13.0 10.9* 10.9* 10.0*  HCT 39.8 32.0* 32.0* 31.5*  MCV 82.7  --   --  81.2  PLT 363  --   --  265    Basic Metabolic Panel: Recent Labs  Lab 11/27/23 1830 11/27/23 1938 11/27/23 2058 11/27/23 2127 11/28/23 0235  NA 130* 131*  --  131* 132*  K 4.7 4.5  --  4.6 5.7*  CL 91*  --   --   --  97*  CO2 31  --   --   --  27  GLUCOSE 114*  --   --   --  134*  BUN 21*  --   --   --  13  CREATININE 1.09  --   --   --  0.80  CALCIUM 9.8  --   --   --  8.3*  MG  --   --  1.2*  --   --    GFR: Estimated Creatinine Clearance: 146 mL/min (by C-G formula based on SCr of 0.8 mg/dL). Recent Labs  Lab 11/27/23 1830 11/27/23 2058 11/27/23 2142 11/27/23 2210 11/28/23 0437  PROCALCITON  --   --   --  1.03  --   WBC 19.3*  --   --   --  16.9*  LATICACIDVEN 1.0 6.0* 1.2  --   --     Liver Function Tests: Recent Labs  Lab 11/27/23 1830 11/28/23 0235  AST 98* 86*  ALT 98* 78*  ALKPHOS 190* 168*  BILITOT 1.5* 2.0*  PROT 9.1* 6.6  ALBUMIN 3.7 2.2*   Recent Labs  Lab 11/27/23 1830  LIPASE 13   Recent Labs  Lab 11/28/23 0209  AMMONIA 40*    ABG    Component Value Date/Time   PHART 7.345 (L) 11/27/2023 2127   PCO2ART 54.3 (H) 11/27/2023 2127   PO2ART 131 (H) 11/27/2023 2127   HCO3 29.4 (H) 11/27/2023 2127   TCO2 31 11/27/2023 2127   O2SAT 99 11/27/2023 2127     Coagulation Profile: Recent Labs  Lab 11/27/23 1830  INR 1.2     Cardiac Enzymes: No results for input(s): "CKTOTAL", "CKMB", "CKMBINDEX", "TROPONINI" in the last 168 hours.  HbA1C: No results found for: "HGBA1C"  CBG: Recent Labs  Lab 11/27/23 2359  GLUCAP 153*     Critical care time:      Melody Comas, MD [facility] Office: [phone]   See Amion for personal pager PCCM on call pager [phone] until 7pm. Please call Elink 7p-7a. [phone]

## 2023-11-28 NOTE — Progress Notes (Signed)
[employer] at bedside w/ IVC paperwork

## 2023-11-28 NOTE — Progress Notes (Addendum)
eLink Physician-Brief Progress Note Patient Name: Brad Evans DOB: 02-Feb-1996 MRN: [id number]   Date of Service  11/28/2023  HPI/Events of Note  Notified of patient complaint of nausea.  Pt also complaining of abdominal pain and refusing tylenol.  eICU Interventions  Zofran ordered PRN.  Tramadol ordered PRN.      Intervention Category Minor Interventions: Routine modifications to care plan (e.g. PRN medications for pain, fever)  Larinda Buttery 11/28/2023, 8:26 PM  5:25 AM Pt refusing suboxone and would prefer methadone. Med list shows suboxone.   Plan> Encourage patient to take suboxone instead.   CBC, BMP ordered.

## 2023-11-28 NOTE — Plan of Care (Signed)

## 2023-11-29 ENCOUNTER — Encounter (HOSPITAL_COMMUNITY): Payer: Self-pay | Admitting: Pulmonary Disease

## 2023-11-29 DIAGNOSIS — R6520 Severe sepsis without septic shock: Secondary | ICD-10-CM

## 2023-11-29 DIAGNOSIS — J9601 Acute respiratory failure with hypoxia: Secondary | ICD-10-CM

## 2023-11-29 DIAGNOSIS — F321 Major depressive disorder, single episode, moderate: Secondary | ICD-10-CM | POA: Insufficient documentation

## 2023-11-29 DIAGNOSIS — F411 Generalized anxiety disorder: Secondary | ICD-10-CM | POA: Insufficient documentation

## 2023-11-29 DIAGNOSIS — F1910 Other psychoactive substance abuse, uncomplicated: Secondary | ICD-10-CM

## 2023-11-29 DIAGNOSIS — A419 Sepsis, unspecified organism: Secondary | ICD-10-CM | POA: Diagnosis not present

## 2023-11-29 LAB — CBC
HCT: 38.4 % — ABNORMAL LOW (ref 39.0–52.0)
Hemoglobin: 12.2 g/dL — ABNORMAL LOW (ref 13.0–17.0)
MCH: 26.3 pg (ref 26.0–34.0)
MCHC: 31.8 g/dL (ref 30.0–36.0)
MCV: 82.8 fL (ref 80.0–100.0)
Platelets: 367 10*3/uL (ref 150–400)
RBC: 4.64 MIL/uL (ref 4.22–5.81)
RDW: 13.9 % (ref 11.5–15.5)
WBC: 12.7 10*3/uL — ABNORMAL HIGH (ref 4.0–10.5)
nRBC: 0 % (ref 0.0–0.2)

## 2023-11-29 LAB — BASIC METABOLIC PANEL
Anion gap: 10 (ref 5–15)
BUN: 6 mg/dL (ref 6–20)
CO2: 31 mmol/L (ref 22–32)
Calcium: 8.5 mg/dL — ABNORMAL LOW (ref 8.9–10.3)
Chloride: 96 mmol/L — ABNORMAL LOW (ref 98–111)
Creatinine, Ser: 0.72 mg/dL (ref 0.61–1.24)
GFR, Estimated: >60 mL/min (ref >60)
Glucose, Bld: 112 mg/dL — ABNORMAL HIGH (ref 70–99)
Potassium: 4.1 mmol/L (ref 3.5–5.1)
Sodium: 137 mmol/L (ref 135–145)

## 2023-11-29 MED ORDER — IPRATROPIUM-ALBUTEROL 0.5-2.5 (3) MG/3ML IN SOLN
3.0000 mL | Freq: Four times a day (QID) | RESPIRATORY_TRACT | Status: DC | PRN
  Administered 2023-11-29: 3 mL via RESPIRATORY_TRACT
  Filled 2023-11-29: qty 3

## 2023-11-29 MED ORDER — CLONIDINE HCL 0.1 MG PO TABS: 0.1000 mg | ORAL_TABLET | Freq: Every day | ORAL | Status: DC

## 2023-11-29 MED ORDER — DM-GUAIFENESIN ER 30-600 MG PO TB12
1.0000 | ORAL_TABLET | Freq: Two times a day (BID) | ORAL | Status: DC | PRN
  Administered 2023-11-29: 1 via ORAL
  Filled 2023-11-29: qty 1

## 2023-11-29 MED ORDER — CLONIDINE HCL 0.1 MG PO TABS: 0.1000 mg | ORAL_TABLET | ORAL | Status: DC

## 2023-11-29 MED ORDER — HYDROXYZINE HCL 25 MG PO TABS
25.0000 mg | ORAL_TABLET | Freq: Four times a day (QID) | ORAL | Status: DC | PRN
  Administered 2023-11-29 – 2023-11-30 (×3): 25 mg via ORAL
  Filled 2023-11-29 (×3): qty 1

## 2023-11-29 MED ORDER — NAPROXEN 250 MG PO TABS: 500.0000 mg | ORAL_TABLET | Freq: Two times a day (BID) | ORAL | Status: DC | PRN

## 2023-11-29 MED ORDER — LOPERAMIDE HCL 2 MG PO CAPS: 2.0000 mg | ORAL_CAPSULE | ORAL | Status: DC | PRN

## 2023-11-29 MED ORDER — METHOCARBAMOL 500 MG PO TABS
500.0000 mg | ORAL_TABLET | Freq: Three times a day (TID) | ORAL | Status: DC | PRN
  Administered 2023-11-29 – 2023-11-30 (×3): 500 mg via ORAL
  Filled 2023-11-29 (×3): qty 1

## 2023-11-29 MED ORDER — CLONIDINE HCL 0.1 MG PO TABS
0.1000 mg | ORAL_TABLET | Freq: Three times a day (TID) | ORAL | Status: DC
  Administered 2023-11-29: 0.1 mg via ORAL
  Filled 2023-11-29: qty 1

## 2023-11-29 MED ORDER — TRAZODONE HCL 50 MG PO TABS
50.0000 mg | ORAL_TABLET | Freq: Every evening | ORAL | Status: DC | PRN
  Administered 2023-11-29: 50 mg via ORAL
  Filled 2023-11-29: qty 1

## 2023-11-29 MED ORDER — METHADONE HCL 10 MG PO TABS
80.0000 mg | ORAL_TABLET | Freq: Every day | ORAL | Status: DC
  Administered 2023-11-29 – 2023-11-30 (×2): 80 mg via ORAL
  Filled 2023-11-29 (×2): qty 8

## 2023-11-29 MED ORDER — DICYCLOMINE HCL 20 MG PO TABS
20.0000 mg | ORAL_TABLET | Freq: Four times a day (QID) | ORAL | Status: DC | PRN
  Administered 2023-11-29: 20 mg via ORAL
  Filled 2023-11-29 (×2): qty 1

## 2023-11-29 NOTE — Progress Notes (Signed)
Spoke with Dr. Valora Piccolo about pt having withdrawal symptoms (sweating, dilated pupils, abd pain, nausea) but continuing to refuse Suboxone throughout the night.  Advised to encourage Suboxone.  Given 0600 this AM.

## 2023-11-29 NOTE — Plan of Care (Signed)

## 2023-11-29 NOTE — Plan of Care (Signed)
  Problem: Education: Goal: Knowledge of General Education information will improve Description: Including pain rating scale, medication(s)/side effects and non-pharmacologic comfort measures 11/29/2023 2348 by Charmian Muff, RN Outcome: Progressing 11/29/2023 2335 by Charmian Muff, RN Outcome: Progressing

## 2023-11-29 NOTE — Plan of Care (Signed)
  Problem: Education: Goal: Knowledge of General Education information will improve Description Including pain rating scale, medication(s)/side effects and non-pharmacologic comfort measures Outcome: Progressing   

## 2023-11-29 NOTE — Consult Note (Cosign Needed)
Brad Evans Psychiatry Consult Evaluation  Service Date: November 29, 2023 LOS:  LOS: 2 days    Primary Psychiatric Diagnoses  Opioid (fentanyl) use disorder 2.  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  GAD  Assessment  Brad Evans is a 27 y.o. male admitted medically for 11/27/2023  6:13 PM for acute respiratory failure secondary to pneumonia. 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 Psychiatry was consulted for concern for self harm and due to IVC from family by Dr. Noralee Stain.   His current presentation of substance use, insomnia, anxious thoughts, passive suicidal ideation is most consistent with OUD, MDD.   Regarding substance use, He meets criteria for opiate use disorder based on increasing tolerance, withdrawal, negative effect on social functioning, cravings.  Current outpatient psychotropic medications include methadone 80 mg and he did well on this medication and was [facility].  He does report having issues with transportation due to his Zenaida Niece breaking down which have prevented him from getting to appointments but reports that he can reestablished with them on his own.  Patient has been restarted on methadone dose and on initial examination, patient is experiencing minimal symptoms of opioid withdrawal. Please see plan below for detailed recommendations.   Regarding mood and anxiety, he meets criteria for MDD and GAD based on endorsing all symptoms of depression with PHQ-9 of 13 and passive SI and endorsing all symptoms of generalized anxiety with GAD-7 of 10.  He has been prescribed different agents in the past including benzodiazepines and sertraline.  He reports some benefit from the sertraline but is not currently on it.  We will discuss restarting this medication or another and establishing with outpatient psychiatry tomorrow.  He  is not open to behavioral interventions including residential treatment, intensive outpatient, visual therapy, NA, or other resources at this time.  Patient is experiencing passive suicidal thoughts but believe he is low risk for suicide.  Please see plan below for detailed recommendations.   Regarding IVC written by parents which addresses concern for safety due to not wanting to pursue medical treatment, this is a questionable reason for IVC.  Patient does not want to currently leave AMA but we will uphold IVC until we get more information given he is high risk for decompensating due to severe sepsis requiring IV antibiotics.  Low suicide risk at this time, as documented below, and would not uphold IVC for this reason or recommend inpatient treatment.  Diagnoses:  Active Hospital problems: Principal Problem:   Sepsis (HCC) Active Problems:   Acute respiratory failure with hypercapnia (HCC)   Pneumonia of right lower lobe due to infectious organism   GAD (generalized anxiety disorder)   MDD (major depressive disorder), single episode, moderate (HCC)     Plan   ## Psychiatric Medication Recommendations:  --Continue methadone 80 mg once daily --Will discuss restarting sertraline versus another agent tomorrow   ## Medical Decision Making Capacity:  Will continue to assess capacity to leave AMA for which he has IVC.   ## Further Work-up:  -- None currently    -- most recent EKG on 11/27/2023 had QtC of 414 -- Pertinent labwork reviewed earlier this admission includes: minimal ethanol level, cocaine and opioids on UDS, hemoglobin around 10-12, unremarkable BMP except hyponatremia, normal B12, borderline normal ammonia, decreased magnesium at 1.2, reactive hepatitis C with HCV RNA pending.   ##  Disposition:  -- There are no psychiatric contraindications to discharge at this time.    ## Behavioral / Environmental:  --   No specific recommendations at this time.  or Utilize compassion  and acknowledge the patient's experiences while setting clear and realistic expectations for care.    ## Safety and Observation Level:  - Based on my clinical evaluation, I estimate the patient to be at low risk of self harm in the current setting - At this time, we recommend a low level of observation. This decision is based on my review of the chart including patient's history and current presentation, interview of the patient, mental status examination, and consideration of suicide risk including evaluating suicidal ideation, plan, intent, suicidal or self-harm behaviors, risk factors, and protective factors. This judgment is based on our ability to directly address suicide risk, implement suicide prevention strategies and develop a safety plan while the patient is in the clinical setting. Please contact our team if there is a concern that risk level has changed.  Suicide risk assessment  Patient has following modifiable risk factors for suicide: under treated depression , which we are addressing by restarting psychiatric medications prior to discharge.   Patient has following non-modifiable or demographic risk factors for suicide: male gender and psychiatric hospitalization  Patient has the following protective factors against suicide: Access to outpatient mental health care, Supportive family, Supportive friends, no history of suicide attempts, and no history of NSSIB   Thank you for this consult request. Recommendations have been communicated to the primary team.  We will continue to follow at this time.   Meryl Dare, MD  Psychiatric and Social History   Relevant Aspects of Hospital Course:  Admitted on 11/27/2023 for acute respiratory failure secondary to pneumonia.  He received BiPAP and was sent to the ICU but is now on the floor and breathing without respiratory support.  He is being treated for severe sepsis secondary to pneumonia.  Patient Report:  Patient does not report any  specific concerns other than his medical reasons for coming in.  He reports that he woke up and back pain and came to hospital.  He cannot give specific details on why he was [facility].  He reported that he [facility].  Psych ROS:  Depression: PHQ-9 13.  More than half a days: Feeling down depressed, changes in eating, hopelessness, concentration; several days: Loss of interest, trouble sleep, low energy, psychomotor retardation, passive SI Anxiety: GAD-7 of 10.  Several days: Feeling anxious, unable to control, worrying too much, restlessness.  more have a days: Trouble relaxing, irritability, fear Mania (lifetime and current): Denies any period of several days in which she had decreased need for sleep, increased activity, and changes in mood. Psychosis: (lifetime and current): Denies current.  Reports having paranoia on cannabis.  Parents report he experienced hallucinations while on cannabis.  Collateral information:  Family was in the room.  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  They report that he was hospitalized when he was 19 due to psychosis secondary to cannabis.  They report concerned about the patient's fentanyl use.   Psychiatric History:  Information collected from patient and family  Prev Dx/Sx: Bipolar which is inaccurate and being removed from the chart, opioid use disorder, substance-induced psychosis Current Psych Provider: None Home Meds (current): Methadone 80 Previous Med Trials: Sertraline 50 with  some response, benzodiazepines for sleep Therapy: None  Prior ECT: None Prior Psych Hospitalization: Age 47 for substance use psychosis Prior Self Harm: None Prior Violence: None  Family Psych History: MDD in sibling and grandmother Family Hx suicide: None  Social History:  Developmental Hx: Grew up with supportive family, no  significant childhood traumas, had some focusing issues growing up per family, grew up in [place] Educational Hx: Did not assess Occupational Hx: [employer], has not had a steady job in over a year and a half and 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  No current court dates Living Situation: Living in [place] and out of a motel.  Zenaida Niece became less stable as housing due to mechanical issues. Spiritual Hx: Did not assess Access to weapons: No  Substance History Tobacco use: Denies Alcohol use: Denies Drug use: Endorses fentanyl use   Exam Findings   Psychiatric Specialty Exam:  Presentation  General Appearance: Appropriate for Environment  Eye Contact:Fair  Speech:Normal Rate  Speech Volume:Normal  Handedness:No data recorded  Mood and Affect  Mood:Depressed  Affect:Depressed; Tearful   Thought Process  Thought Processes:Coherent; Linear  Descriptions of Associations:Intact  Orientation:Full (Time, Place and Person)  Thought Content:Logical  Hallucinations:Hallucinations: None  Ideas of Reference:None  Suicidal Thoughts:Suicidal Thoughts: Yes, Passive  Homicidal Thoughts:Homicidal Thoughts: No   Sensorium  Memory:Immediate Good; Recent Good; Remote Good  Judgment:Fair  Insight:Fair   Executive Functions  Concentration:Fair  Attention Span:Fair  Recall:Good  Fund of Knowledge:Good  Language:Good   Psychomotor Activity  Psychomotor Activity:Psychomotor Activity: Decreased   Assets  Assets:Desire for Improvement; Social Support; Vocational/Educational   Sleep  Sleep:Sleep: Fair    Physical Exam: Vital signs:  Temp:  [98.3 F (36.8 C)-99.2 F (37.3 C)] 99.2 F (37.3 C) (12/02 1650) Pulse Rate:  [75-80] 80 (12/02 1650) Resp:  [16-20] 18 (12/02 1650) BP: (113-133)/(74-88) 133/88 (12/02 1650) SpO2:  [93 %-98 %] 98 % (12/02 1650) Weight:  [76 kg-76.1 kg] 76 kg (12/02 0500) Physical  Exam Vitals and nursing note reviewed.  HENT:     Head: Normocephalic and atraumatic.  Pulmonary:     Effort: Pulmonary effort is normal.  Neurological:     General: No focal deficit present.     Mental Status: He is alert.     Blood pressure 133/88, pulse 80, temperature 99.2 F (37.3 C), temperature source Oral, resp. rate 18, weight 76 kg, SpO2 98%. Body mass index is 22.72 kg/m.   Other History   These have been pulled in through the EMR, reviewed, and updated if appropriate.   Family History:  MDD in first and second-degree relative  Medical History: Past Medical History:  Diagnosis Date   Brain tumor [facility])    treated as a child    Surgical History: History reviewed. No pertinent surgical history.  Medications:   Current Facility-Administered Medications:    acetaminophen (TYLENOL) tablet 650 mg, 650 mg, Oral, Q4H PRN, Lidia Collum, PA-C, 650 mg at 11/29/23 0542   azithromycin (ZITHROMAX) 500 mg in sodium chloride 0.9 % 250 mL IVPB, 500 mg, Intravenous, Q24H, Albustami, Flonnie Hailstone, MD, Stopped at 11/29/23 0011   dextromethorphan-guaiFENesin (MUCINEX DM) 30-600 MG per 12 hr tablet 1 tablet, 1 tablet, Oral, BID PRN, Noralee Stain, DO, 1 tablet at 11/29/23 1805   dicyclomine (BENTYL) tablet 20 mg, 20 mg, Oral, Q6H PRN, Noralee Stain, DO, 20 mg at 11/29/23 1811   docusate sodium (COLACE) capsule 100 mg, 100  mg, Oral, BID PRN, Lidia Collum, PA-C   enoxaparin (LOVENOX) injection 40 mg, 40 mg, Subcutaneous, Q24H, Lidia Collum, PA-C, 40 mg at 11/29/23 1610   hydrOXYzine (ATARAX) tablet 25 mg, 25 mg, Oral, Q6H PRN, Noralee Stain, DO, 25 mg at 11/29/23 9604   ipratropium-albuterol (DUONEB) 0.5-2.5 (3) MG/3ML nebulizer solution 3 mL, 3 mL, Nebulization, Q6H PRN, Martina Sinner, MD, 3 mL at 11/29/23 1759   loperamide (IMODIUM) capsule 2-4 mg, 2-4 mg, Oral, PRN, Noralee Stain, DO   methadone (DOLOPHINE) tablet 80 mg, 80 mg, Oral, Daily, Noralee Stain, DO, 80 mg at  11/29/23 5409   methocarbamol (ROBAXIN) tablet 500 mg, 500 mg, Oral, Q8H PRN, Noralee Stain, DO, 500 mg at 11/29/23 8119   naproxen (NAPROSYN) tablet 500 mg, 500 mg, Oral, BID PRN, Noralee Stain, DO   nicotine (NICODERM CQ - dosed in mg/24 hours) patch 14 mg, 14 mg, Transdermal, Daily, Lidia Collum, PA-C, 14 mg at 11/29/23 0844   ondansetron (ZOFRAN) injection 4 mg, 4 mg, Intravenous, Q6H PRN, Larinda Buttery, MD, 4 mg at 11/28/23 2338   Oral care mouth rinse, 15 mL, Mouth Rinse, PRN, Albustami, Flonnie Hailstone, MD   piperacillin-tazobactam (ZOSYN) IVPB 3.375 g, 3.375 g, Intravenous, Q8H, Melody Comas B, MD, Last Rate: 12.5 mL/hr at 11/29/23 1704, 3.375 g at 11/29/23 1704   polyethylene glycol (MIRALAX / GLYCOLAX) packet 17 g, 17 g, Oral, Daily PRN, Suzie Portela, John D, PA-C   senna (SENOKOT) tablet 17.2 mg, 2 tablet, Oral, QHS, Dewald, Bettina Gavia, MD   traMADol (ULTRAM) tablet 50 mg, 50 mg, Oral, Q6H PRN, Larinda Buttery, MD, 50 mg at 11/29/23 1811   traZODone (DESYREL) tablet 50 mg, 50 mg, Oral, QHS PRN, Noralee Stain, DO, 50 mg at 11/29/23 1805  Allergies: No Known Allergies

## 2023-11-29 NOTE — Progress Notes (Signed)
PROGRESS NOTE    Brad Evans  WGN:[id number] DOB: 1996/08/16 DOA: 11/27/2023 PCP: Patient, No Pcp Per     Brief Narrative:  Brad Evans is a 27 year old male with pertinent PMHx brain tumor removed 5 years ago followed by Kateri Mc, bipolar 2 disorder, IV drug use presents to [facility] on 11/30 with abdominal pain.   Patient states he has been having lower abdominal pain over the past few days but is significantly worsening and now radiating to the right lower back.  Patient also having a cough, SOB, chest pain described as pleuritic pain, fever, chills.  Patient states he used IV drugs a couple days ago.  Came to [facility] on 11/30 for further eval.  On arrival patient ill-appearing and febrile 102.1 F.  Patient have abdominal tenderness.  Cultures obtained, given IV fluids, started on broad-spectrum antibiotics.  CTA chest/abdomen/pelvis with no PE, RLL consolidation; trace right pleural effusion; bilateral hilar lymphadenopathy; splenomegaly; increased stool burden in cecum and ascending colon likely constipation.  UA clear.  LA 1.  WBC 19.3.  Lipase 13.  Trops flat.  COVID/flu negative.  ABG 7.29, 58, 107, 28.  Patient placed on BiPAP with some improvement ABG.  PCCM consulted for ICU admission.  Patient attempted to leave AGAINST MEDICAL ADVICE 12/1.  He was IVC by family.  Patient was weaned off BiPAP and transferred to [facility] service 12/2.  New events last 24 hours / Subjective: Patient wanting to leave AMA.  He is currently IVC by family.  Sitter is at bedside.  Also, yesterday morning there was report about patient's friend who brought in a black box, there were needles found inside.  Assessment & Plan:   Principal Problem:   Sepsis (HCC) Active Problems:   Bipolar affective disorder (HCC)   Acute respiratory failure with hypercapnia (HCC)   Pneumonia of right lower lobe due to infectious organism   IV drug abuse (HCC)   Acute hypoxemic and hypercarbic respiratory  failure -Required BiPAP on admission -Currently on room air  Severe sepsis secondary to CAP -Sepsis present on admission -Vancomycin, Zosyn, azithromycin--> MRSA PCR is negative.  Vanco can be discontinued today -WBC continues to improve  IV drug abuse -Followed by methadone clinic, [facility].  Discussed with their staff today.  He was last given methadone by their clinic 80 mg 11/29.  He has been intermittently coming into the clinic, treatment started 11/1 -Admitted to using IV fentanyl couple days prior to hospitalization -Methadone resumed  HCV -HCV antibody reactive.  Follow-up with nucleic acid amplification - ordered  -HIV NR    DVT prophylaxis:  enoxaparin (LOVENOX) injection 40 mg Start: 11/28/23 1000  Code Status: Full Family Communication: Left voicemail for mother. Met father at bedside Disposition Plan: Home Status is: Inpatient Remains inpatient appropriate because: IV antibiotics. He was IVC by family. Psych consulted to assist    Antimicrobials:  Anti-infectives (From admission, onward)    Start     Dose/Rate Route Frequency Ordered Stop   11/28/23 0945  piperacillin-tazobactam (ZOSYN) IVPB 3.375 g        3.375 g 12.5 mL/hr over 240 Minutes Intravenous Every 8 hours 11/28/23 0845     11/28/23 0800  vancomycin (VANCOCIN) IVPB 1000 mg/200 mL premix  Status:  Discontinued        1,000 mg 200 mL/hr over 60 Minutes Intravenous Every 12 hours 11/27/23 1952 11/29/23 0806   11/28/23 0300  cefTRIAXone (ROCEPHIN) 2 g in sodium chloride 0.9 % 100 mL  IVPB  Status:  Discontinued        2 g 200 mL/hr over 30 Minutes Intravenous Daily at bedtime 11/27/23 2359 11/28/23 0845   11/28/23 0000  azithromycin (ZITHROMAX) 250 MG tablet           11/28/23 0806     11/28/23 0000  amoxicillin-clavulanate (AUGMENTIN) 875-125 MG tablet        1 tablet Oral 2 times daily 11/28/23 0806     11/27/23 2200  azithromycin (ZITHROMAX) 500 mg in sodium chloride 0.9 % 250 mL IVPB         500 mg 250 mL/hr over 60 Minutes Intravenous Every 24 hours 11/27/23 2142 11/30/23 2214   11/27/23 2045  vancomycin (VANCOCIN) 500 mg in sodium chloride 0.9 % 100 mL IVPB       Placed in "Followed by" Linked Group   500 mg 100 mL/hr over 60 Minutes Intravenous  Once 11/27/23 1910 11/27/23 2338   11/27/23 1930  vancomycin (VANCOCIN) IVPB 1000 mg/200 mL premix       Placed in "Followed by" Linked Group   1,000 mg 200 mL/hr over 60 Minutes Intravenous  Once 11/27/23 1910 11/27/23 2228   11/27/23 1900  ceFEPIme (MAXIPIME) 2 g in sodium chloride 0.9 % 100 mL IVPB        2 g 200 mL/hr over 30 Minutes Intravenous  Once 11/27/23 1849 11/27/23 1957   11/27/23 1900  metroNIDAZOLE (FLAGYL) IVPB 500 mg        500 mg 100 mL/hr over 60 Minutes Intravenous  Once 11/27/23 1849 11/27/23 2130   11/27/23 1900  vancomycin (VANCOCIN) IVPB 1000 mg/200 mL premix  Status:  Discontinued        1,000 mg 200 mL/hr over 60 Minutes Intravenous  Once 11/27/23 1849 11/27/23 1908        Objective: Vitals:   11/28/23 2353 11/29/23 0500 11/29/23 0857 11/29/23 1159  BP: 113/74  117/77 133/83  Pulse: 75  77 75  Resp: 16  18 19   Temp: 98.7 F (37.1 C)  98.3 F (36.8 C) 98.8 F (37.1 C)  TempSrc: Oral  Oral Oral  SpO2: 97%  93% 95%  Weight:  76 kg      Intake/Output Summary (Last 24 hours) at 11/29/2023 1332 Last data filed at 11/29/2023 0954 Gross per 24 hour  Intake 2410.49 ml  Output 1150 ml  Net 1260.49 ml   Filed Weights   11/28/23 0315 11/28/23 1907 11/29/23 0500  Weight: 74.4 kg 76.1 kg 76 kg    Examination:  General exam: Appears agitated, anxious  Respiratory system: Clear to auscultation. Respiratory effort normal. No respiratory distress. No conversational dyspnea.  Cardiovascular system: S1 & S2 heard, RRR. No murmurs. No pedal edema. Gastrointestinal system: Abdomen is nondistended, soft and nontender. Normal bowel sounds heard. Central nervous system: Alert and oriented. No focal  neurological deficits. Speech clear.  Extremities: Symmetric in appearance  Skin: No rashes, lesions or ulcers on exposed skin  Psychiatry: Judgement and insight appear poor  Data Reviewed: I have personally reviewed following labs and imaging studies  CBC: Recent Labs  Lab 11/27/23 1830 11/27/23 1938 11/27/23 2127 11/28/23 0437 11/29/23 1002  WBC 19.3*  --   --  16.9* 12.7*  NEUTROABS 15.7*  --   --   --   --   HGB 13.0 10.9* 10.9* 10.0* 12.2*  HCT 39.8 32.0* 32.0* 31.5* 38.4*  MCV 82.7  --   --  81.2 82.8  PLT 363  --   --  265 367   Basic Metabolic Panel: Recent Labs  Lab 11/27/23 1830 11/27/23 1938 11/27/23 2058 11/27/23 2127 11/28/23 0235 11/29/23 1002  NA 130* 131*  --  131* 132* 137  K 4.7 4.5  --  4.6 5.7* 4.1  CL 91*  --   --   --  97* 96*  CO2 31  --   --   --  27 31  GLUCOSE 114*  --   --   --  134* 112*  BUN 21*  --   --   --  13 6  CREATININE 1.09  --   --   --  0.80 0.72  CALCIUM 9.8  --   --   --  8.3* 8.5*  MG  --   --  1.2*  --   --   --    GFR: Estimated Creatinine Clearance: 149.1 mL/min (by C-G formula based on SCr of 0.72 mg/dL). Liver Function Tests: Recent Labs  Lab 11/27/23 1830 11/28/23 0235  AST 98* 86*  ALT 98* 78*  ALKPHOS 190* 168*  BILITOT 1.5* 2.0*  PROT 9.1* 6.6  ALBUMIN 3.7 2.2*   Recent Labs  Lab 11/27/23 1830  LIPASE 13   Recent Labs  Lab 11/28/23 0209  AMMONIA 40*   Coagulation Profile: Recent Labs  Lab 11/27/23 1830  INR 1.2   Cardiac Enzymes: No results for input(s): "CKTOTAL", "CKMB", "CKMBINDEX", "TROPONINI" in the last 168 hours. BNP (last 3 results) No results for input(s): "PROBNP" in the last 8760 hours. HbA1C: No results for input(s): "HGBA1C" in the last 72 hours. CBG: Recent Labs  Lab 11/27/23 2359  GLUCAP 153*   Lipid Profile: No results for input(s): "CHOL", "HDL", "LDLCALC", "TRIG", "CHOLHDL", "LDLDIRECT" in the last 72 hours. Thyroid Function Tests: No results for input(s): "TSH",  "T4TOTAL", "FREET4", "T3FREE", "THYROIDAB" in the last 72 hours. Anemia Panel: Recent Labs    11/28/23 0235 11/28/23 0437  VITAMINB12  --  425  FOLATE 7.8  --   FERRITIN  --  161  TIBC  --  199*  IRON  --  18*   Sepsis Labs: Recent Labs  Lab 11/27/23 1830 11/27/23 2058 11/27/23 2142 11/27/23 2210  PROCALCITON  --   --   --  1.03  LATICACIDVEN 1.0 6.0* 1.2  --     Recent Results (from the past 240 hour(s))  Culture, blood (Routine x 2)     Status: None (Preliminary result)   Collection Time: 11/27/23  6:30 PM   Specimen: BLOOD  Result Value Ref Range Status   Specimen Description   Final    BLOOD LEFT ANTECUBITAL Performed at [facility], [address] [place], [place] [address]    Special Requests   Final    BOTTLES DRAWN AEROBIC AND ANAEROBIC Blood Culture adequate volume Performed at [facility], [address] [place], [place] [address]    Culture   Final    NO GROWTH 2 DAYS Performed at [facility], [address] [place], [place] [address]    Report Status PENDING  Incomplete  Culture, blood (Routine x 2)     Status: None (Preliminary result)   Collection Time: 11/27/23  6:34 PM   Specimen: BLOOD  Result Value Ref Range Status   Specimen Description   Final    BLOOD BLOOD LEFT FOREARM Performed at [facility], [address] [place], [place] [address]    Special Requests   Final  AEROBIC BOTTLE ONLY Blood Culture adequate volume Performed at [facility], [address] [place], [place] [address]    Culture   Final    NO GROWTH 2 DAYS Performed at [facility], [address] [place], [place] [address]    Report Status PENDING  Incomplete  Resp panel by RT-PCR (RSV, Flu A&B, Covid) Anterior Nasal Swab     Status: None   Collection Time: 11/27/23  6:50 PM   Specimen: Anterior Nasal Swab  Result Value Ref Range Status   SARS Coronavirus 2 by RT PCR  NEGATIVE NEGATIVE Final    Comment: (NOTE) SARS-CoV-2 target nucleic acids are NOT DETECTED.  The SARS-CoV-2 RNA is generally detectable in upper respiratory specimens during the acute phase of infection. The lowest concentration of SARS-CoV-2 viral copies this assay can detect is 138 copies/mL. A negative result does not preclude SARS-Cov-2 infection and should not be used as the sole basis for treatment or other patient management decisions. A negative result may occur with  improper specimen collection/handling, submission of specimen other than nasopharyngeal swab, presence of viral mutation(s) within the areas targeted by this assay, and inadequate number of viral copies(<138 copies/mL). A negative result must be combined with clinical observations, patient history, and epidemiological information. The expected result is Negative.  Fact Sheet for Patients:  BloggerCourse.com  Fact Sheet for Healthcare Providers:  SeriousBroker.it  This test is no t yet approved or cleared by the [place] FDA and  has been authorized for detection and/or diagnosis of SARS-CoV-2 by FDA under an Emergency Use Authorization (EUA). This EUA will remain  in effect (meaning this test can be used) for the duration of the COVID-19 declaration under Section 564(b)(1) of the Act, 21 U.S.C.section 360bbb-3(b)(1), unless the authorization is terminated  or revoked sooner.       Influenza A by PCR NEGATIVE NEGATIVE Final   Influenza B by PCR NEGATIVE NEGATIVE Final    Comment: (NOTE) The Xpert Xpress SARS-CoV-2/FLU/RSV plus assay is intended as an aid in the diagnosis of influenza from Nasopharyngeal swab specimens and should not be used as a sole basis for treatment. Nasal washings and aspirates are unacceptable for Xpert Xpress SARS-CoV-2/FLU/RSV testing.  Fact Sheet for Patients: BloggerCourse.com  Fact Sheet for  Healthcare Providers: SeriousBroker.it  This test is not yet approved or cleared by the [place] FDA and has been authorized for detection and/or diagnosis of SARS-CoV-2 by FDA under an Emergency Use Authorization (EUA). This EUA will remain in effect (meaning this test can be used) for the duration of the COVID-19 declaration under Section 564(b)(1) of the Act, 21 U.S.C. section 360bbb-3(b)(1), unless the authorization is terminated or revoked.     Resp Syncytial Virus by PCR NEGATIVE NEGATIVE Final    Comment: (NOTE) Fact Sheet for Patients: BloggerCourse.com  Fact Sheet for Healthcare Providers: SeriousBroker.it  This test is not yet approved or cleared by the [place] FDA and has been authorized for detection and/or diagnosis of SARS-CoV-2 by FDA under an Emergency Use Authorization (EUA). This EUA will remain in effect (meaning this test can be used) for the duration of the COVID-19 declaration under Section 564(b)(1) of the Act, 21 U.S.C. section 360bbb-3(b)(1), unless the authorization is terminated or revoked.  Performed at [facility], [address] [place], [place] [address]   MRSA Next Gen by PCR, Nasal     Status: None   Collection Time: 11/27/23 11:45 PM   Specimen: Nasal Mucosa; Nasal  Swab  Result Value Ref Range Status   MRSA by PCR Next Gen NOT DETECTED NOT DETECTED Final    Comment: (NOTE) The GeneXpert MRSA Assay (FDA approved for NASAL specimens only), is one component of a comprehensive MRSA colonization surveillance program. It is not intended to diagnose MRSA infection nor to guide or monitor treatment for MRSA infections. Test performance is not FDA approved in patients less than 52 years old. Performed at [facility], [address], [place], [place] [address]   Respiratory (~20 pathogens) panel by PCR     Status: None   Collection  Time: 11/27/23 11:45 PM   Specimen: Nasopharyngeal Swab; Respiratory  Result Value Ref Range Status   Adenovirus NOT DETECTED NOT DETECTED Final   Coronavirus 229E NOT DETECTED NOT DETECTED Final    Comment: (NOTE) The Coronavirus on the Respiratory Panel, DOES NOT test for the novel  Coronavirus (2019 nCoV)    Coronavirus HKU1 NOT DETECTED NOT DETECTED Final   Coronavirus NL63 NOT DETECTED NOT DETECTED Final   Coronavirus OC43 NOT DETECTED NOT DETECTED Final   Metapneumovirus NOT DETECTED NOT DETECTED Final   Rhinovirus / Enterovirus NOT DETECTED NOT DETECTED Final   Influenza A NOT DETECTED NOT DETECTED Final   Influenza B NOT DETECTED NOT DETECTED Final   Parainfluenza Virus 1 NOT DETECTED NOT DETECTED Final   Parainfluenza Virus 2 NOT DETECTED NOT DETECTED Final   Parainfluenza Virus 3 NOT DETECTED NOT DETECTED Final   Parainfluenza Virus 4 NOT DETECTED NOT DETECTED Final   Respiratory Syncytial Virus NOT DETECTED NOT DETECTED Final   Bordetella pertussis NOT DETECTED NOT DETECTED Final   Bordetella Parapertussis NOT DETECTED NOT DETECTED Final   Chlamydophila pneumoniae NOT DETECTED NOT DETECTED Final   Mycoplasma pneumoniae NOT DETECTED NOT DETECTED Final    Comment: Performed at [facility], [address], [place], [place] [address]      Radiology Studies: ECHOCARDIOGRAM COMPLETE  Result Date: 11/28/2023    ECHOCARDIOGRAM REPORT   Patient Name:   Brad Evans Date of Exam: 11/28/2023 Medical Rec #:  [id number]        Height:       72.0 in Accession #:    [id number]       Weight:       164.0 lb Date of Birth:  11/17/1996         BSA:          1.958 m Patient Age:    27 years         BP:           97/80 mmHg Patient Gender: M                HR:           68 bpm. Exam Location:  Inpatient Procedure: 2D Echo, 3D Echo, Color Doppler, Cardiac Doppler and Strain Analysis Indications:    Fever  History:        Patient has no prior history of Echocardiogram examinations.                  Risk Factors:Current Smoker. Brain Tumor.  Sonographer:    Raeford Razor RDCS Referring Phys: [id number] OMAR M ALBUSTAMI IMPRESSIONS  1. Left ventricular ejection fraction, by estimation, is 55 to 60%. The left ventricle has normal function. The left ventricle has no regional wall motion abnormalities. Left ventricular diastolic parameters were normal. The average left ventricular global longitudinal strain is -28.2 %.  The global longitudinal strain is normal.  2. Right ventricular systolic function is normal. The right ventricular size is normal. There is moderately elevated pulmonary artery systolic pressure.  3. The mitral valve is normal in structure. Trivial mitral valve regurgitation. No evidence of mitral stenosis.  4. The tricuspid valve is abnormal.  5. The aortic valve is tricuspid. Aortic valve regurgitation is not visualized. No aortic stenosis is present.  6. The inferior vena cava is dilated in size with <50% respiratory variability, suggesting right atrial pressure of 15 mmHg. FINDINGS  Left Ventricle: Left ventricular ejection fraction, by estimation, is 55 to 60%. The left ventricle has normal function. The left ventricle has no regional wall motion abnormalities. The average left ventricular global longitudinal strain is -28.2 %. The global longitudinal strain is normal. The left ventricular internal cavity size was normal in size. There is no left ventricular hypertrophy. Left ventricular diastolic parameters were normal. Right Ventricle: The right ventricular size is normal. Right vetricular wall thickness was not well visualized. Right ventricular systolic function is normal. There is moderately elevated pulmonary artery systolic pressure. The tricuspid regurgitant velocity is 2.63 m/s, and with an assumed right atrial pressure of 15 mmHg, the estimated right ventricular systolic pressure is 42.7 mmHg. Left Atrium: Left atrial size was normal in size. Right Atrium: Right atrial size was  normal in size. Pericardium: There is no evidence of pericardial effusion. Mitral Valve: The mitral valve is normal in structure. Trivial mitral valve regurgitation. No evidence of mitral valve stenosis. Tricuspid Valve: The tricuspid valve is abnormal. Tricuspid valve regurgitation is mild . No evidence of tricuspid stenosis. Aortic Valve: The aortic valve is tricuspid. Aortic valve regurgitation is not visualized. No aortic stenosis is present. Aortic valve mean gradient measures 4.0 mmHg. Aortic valve peak gradient measures 7.7 mmHg. Aortic valve area, by VTI measures 2.16 cm. Pulmonic Valve: The pulmonic valve was not well visualized. Pulmonic valve regurgitation is mild. No evidence of pulmonic stenosis. Aorta: The aortic root is normal in size and structure. Venous: The inferior vena cava is dilated in size with less than 50% respiratory variability, suggesting right atrial pressure of 15 mmHg. IAS/Shunts: No atrial level shunt detected by color flow Doppler.  LEFT VENTRICLE PLAX 2D LVIDd:         5.20 cm   Diastology LVIDs:         3.80 cm   LV e' medial:  16.20 cm/s LV PW:         0.80 cm   LV e' lateral: 23.20 cm/s LV IVS:        0.80 cm LVOT diam:     2.10 cm   2D Longitudinal Strain LV SV:         62        2D Strain GLS Avg:     -28.2 % LV SV Index:   32 LVOT Area:     3.46 cm                           3D Volume EF:                          3D EF:        55 %                          LV EDV:  183 ml                          LV ESV:       83 ml                          LV SV:        100 ml RIGHT VENTRICLE             IVC RV Basal diam:  3.10 cm     IVC diam: 2.80 cm RV S prime:     19.60 cm/s TAPSE (M-mode): 2.8 cm LEFT ATRIUM             Index        RIGHT ATRIUM           Index LA diam:        3.20 cm 1.63 cm/m   RA Area:     13.80 cm LA Vol (A2C):   43.8 ml 22.37 ml/m  RA Volume:   34.10 ml  17.41 ml/m LA Vol (A4C):   25.4 ml 12.97 ml/m LA Biplane Vol: 35.9 ml 18.33 ml/m  AORTIC VALVE AV  Area (Vmax):    2.44 cm AV Area (Vmean):   2.10 cm AV Area (VTI):     2.16 cm AV Vmax:           139.00 cm/s  PULMONARY ARTERY AV Vmean:          96.200 cm/s  MPA diam:        2.50 cm AV VTI:            0.289 m AV Peak Grad:      7.7 mmHg AV Mean Grad:      4.0 mmHg LVOT Vmax:         98.00 cm/s LVOT Vmean:        58.200 cm/s LVOT VTI:          0.180 m LVOT/AV VTI ratio: 0.62  AORTA Ao Root diam: 3.00 cm Ao Asc diam:  2.60 cm TRICUSPID VALVE TR Peak grad:   27.7 mmHg TR Vmax:        263.00 cm/s  SHUNTS Systemic VTI:  0.18 m Systemic Diam: 2.10 cm Dina Rich MD Electronically signed by Dina Rich MD Signature Date/Time: 11/28/2023/12:41:52 PM    Final    CT ABDOMEN PELVIS W CONTRAST  Result Date: 11/27/2023 CLINICAL DATA:  Cough x 2 weeks, lower back pain and dark urine. Abdominal pain, acute, nonlocalized Fever, abdominal and chest pain, hx IVDU EXAM: CT ANGIOGRAPHY CHEST CT ABDOMEN AND PELVIS WITH CONTRAST TECHNIQUE: Multidetector CT imaging of the chest was performed using the standard protocol during bolus administration of intravenous contrast. Multiplanar CT image reconstructions and MIPs were obtained to evaluate the vascular anatomy. Multidetector CT imaging of the abdomen and pelvis was performed using the standard protocol during bolus administration of intravenous contrast. RADIATION DOSE REDUCTION: This exam was performed according to the departmental dose-optimization program which includes automated exposure control, adjustment of the mA and/or kV according to patient size and/or use of iterative reconstruction technique. CONTRAST:  OMNIPAQUE IOHEXOL 350 MG/ML SOLN COMPARISON:  None Available. FINDINGS: CTA CHEST FINDINGS Cardiovascular: Satisfactory opacification of the pulmonary arteries to the segmental level. No evidence of pulmonary embolism. Normal heart size. No significant pericardial effusion. The thoracic aorta is normal in caliber. No atherosclerotic plaque of the  thoracic aorta.  No coronary artery calcifications. Mediastinum/Nodes: Likely residual thymus within the anterior mediastinum. Enlarged right hilar lymph node measuring 1.6 cm. Enlarged left hilar lymph node measuring 1.2 cm. No mediastinal lymphadenopathy. No axillary lymphadenopathy. Thyroid gland, trachea, and esophagus demonstrate no significant findings. Lungs/Pleura: Heterogeneous right lower lobe consolidation. Some passive atelectasis of the right lower lobe. No pulmonary nodule. No pulmonary mass. Loculated trace to small volume right pleural effusion. Pneumothorax. Musculoskeletal: Bilateral gynecomastia. No suspicious lytic or blastic osseous lesions. No acute displaced fracture. Multilevel degenerative changes of the spine. Review of the MIP images confirms the above findings. CT ABDOMEN and PELVIS FINDINGS Hepatobiliary: No focal liver abnormality. No gallstones, gallbladder wall thickening, or pericholecystic fluid. No biliary dilatation. Pancreas: No focal lesion. Normal pancreatic contour. No surrounding inflammatory changes. No main pancreatic ductal dilatation. Spleen: Spleen is enlarged measuring up to 17 cm. Adrenals/Urinary Tract: No adrenal nodule bilaterally. Bilateral kidneys enhance symmetrically. No hydronephrosis. No hydroureter. The urinary bladder is unremarkable. Stomach/Bowel: Stomach is within normal limits. Increased stool burden throughout the cecum and ascending colon/hepatic flexure. No evidence of bowel wall thickening or dilatation. Appendix appears normal. Vascular/Lymphatic: No abdominal aorta or iliac aneurysm. No abdominal, pelvic, or inguinal lymphadenopathy. Reproductive: Prostate is unremarkable. Other: No intraperitoneal free fluid. No intraperitoneal free gas. No organized fluid collection. Musculoskeletal: No abdominal wall hernia or abnormality. No suspicious lytic or blastic osseous lesions. No acute displaced fracture. Review of the MIP images confirms the above  findings. IMPRESSION: 1. No pulmonary embolus. 2. Heterogeneous right lower lobe consolidation suggestive of infection with no definite abscess formation. 3. Loculated trace to small volume right pleural effusion. 4. Bilateral hilar lymphadenopathy may be reactive in etiology. 5. Splenomegaly. Finding could be due to lymphoproliferative disorder. 6. Increased stool burden throughout the cecum and ascending colon. Correlate constipation. Electronically Signed   By: Tish Frederickson M.D.   On: 11/27/2023 20:42   CT Angio Chest PE W/Cm &/Or Wo Cm  Result Date: 11/27/2023 CLINICAL DATA:  Cough x 2 weeks, lower back pain and dark urine. Abdominal pain, acute, nonlocalized Fever, abdominal and chest pain, hx IVDU EXAM: CT ANGIOGRAPHY CHEST CT ABDOMEN AND PELVIS WITH CONTRAST TECHNIQUE: Multidetector CT imaging of the chest was performed using the standard protocol during bolus administration of intravenous contrast. Multiplanar CT image reconstructions and MIPs were obtained to evaluate the vascular anatomy. Multidetector CT imaging of the abdomen and pelvis was performed using the standard protocol during bolus administration of intravenous contrast. RADIATION DOSE REDUCTION: This exam was performed according to the departmental dose-optimization program which includes automated exposure control, adjustment of the mA and/or kV according to patient size and/or use of iterative reconstruction technique. CONTRAST:  OMNIPAQUE IOHEXOL 350 MG/ML SOLN COMPARISON:  None Available. FINDINGS: CTA CHEST FINDINGS Cardiovascular: Satisfactory opacification of the pulmonary arteries to the segmental level. No evidence of pulmonary embolism. Normal heart size. No significant pericardial effusion. The thoracic aorta is normal in caliber. No atherosclerotic plaque of the thoracic aorta. No coronary artery calcifications. Mediastinum/Nodes: Likely residual thymus within the anterior mediastinum. Enlarged right hilar lymph node  measuring 1.6 cm. Enlarged left hilar lymph node measuring 1.2 cm. No mediastinal lymphadenopathy. No axillary lymphadenopathy. Thyroid gland, trachea, and esophagus demonstrate no significant findings. Lungs/Pleura: Heterogeneous right lower lobe consolidation. Some passive atelectasis of the right lower lobe. No pulmonary nodule. No pulmonary mass. Loculated trace to small volume right pleural effusion. Pneumothorax. Musculoskeletal: Bilateral gynecomastia. No suspicious lytic or blastic osseous lesions. No acute displaced fracture. Multilevel degenerative changes  of the spine. Review of the MIP images confirms the above findings. CT ABDOMEN and PELVIS FINDINGS Hepatobiliary: No focal liver abnormality. No gallstones, gallbladder wall thickening, or pericholecystic fluid. No biliary dilatation. Pancreas: No focal lesion. Normal pancreatic contour. No surrounding inflammatory changes. No main pancreatic ductal dilatation. Spleen: Spleen is enlarged measuring up to 17 cm. Adrenals/Urinary Tract: No adrenal nodule bilaterally. Bilateral kidneys enhance symmetrically. No hydronephrosis. No hydroureter. The urinary bladder is unremarkable. Stomach/Bowel: Stomach is within normal limits. Increased stool burden throughout the cecum and ascending colon/hepatic flexure. No evidence of bowel wall thickening or dilatation. Appendix appears normal. Vascular/Lymphatic: No abdominal aorta or iliac aneurysm. No abdominal, pelvic, or inguinal lymphadenopathy. Reproductive: Prostate is unremarkable. Other: No intraperitoneal free fluid. No intraperitoneal free gas. No organized fluid collection. Musculoskeletal: No abdominal wall hernia or abnormality. No suspicious lytic or blastic osseous lesions. No acute displaced fracture. Review of the MIP images confirms the above findings. IMPRESSION: 1. No pulmonary embolus. 2. Heterogeneous right lower lobe consolidation suggestive of infection with no definite abscess formation. 3.  Loculated trace to small volume right pleural effusion. 4. Bilateral hilar lymphadenopathy may be reactive in etiology. 5. Splenomegaly. Finding could be due to lymphoproliferative disorder. 6. Increased stool burden throughout the cecum and ascending colon. Correlate constipation. Electronically Signed   By: Tish Frederickson M.D.   On: 11/27/2023 20:42      Scheduled Meds:  enoxaparin (LOVENOX) injection  40 mg Subcutaneous Q24H   methadone  80 mg Oral Daily   nicotine  14 mg Transdermal Daily   senna  2 tablet Oral QHS   Continuous Infusions:  azithromycin Stopped (11/29/23 0011)   piperacillin-tazobactam (ZOSYN)  IV 3.375 g (11/29/23 1008)     LOS: 2 days   Time spent: 45 minutes   Noralee Stain, DO [facility] 11/29/2023, 1:32 PM   Available via Epic secure chat 7am-7pm After these hours, please refer to coverage provider listed on [email or web address]
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
## 2023-11-29 NOTE — Progress Notes (Addendum)
Dr. Alvino Chapel ordered Methadone 80mg .  Patient accepted medication.   Patient's father arrived and encouraged patient to accept treatment.   Dr Alvino Chapel spoke with father.  Dr Alvino Chapel states not to allow patient to walk in the hall due to being a flight risk.  No visitation except from patient's mother and father, per Dr. Alvino Chapel.  Treatment plan continues.

## 2023-11-30 DIAGNOSIS — F1121 Opioid dependence, in remission: Secondary | ICD-10-CM | POA: Diagnosis not present

## 2023-11-30 DIAGNOSIS — A419 Sepsis, unspecified organism: Secondary | ICD-10-CM | POA: Diagnosis not present

## 2023-11-30 DIAGNOSIS — R6520 Severe sepsis without septic shock: Secondary | ICD-10-CM | POA: Diagnosis not present

## 2023-11-30 DIAGNOSIS — J9601 Acute respiratory failure with hypoxia: Secondary | ICD-10-CM | POA: Diagnosis not present

## 2023-11-30 LAB — COMPREHENSIVE METABOLIC PANEL
ALT: 57 U/L — ABNORMAL HIGH (ref 0–44)
AST: 52 U/L — ABNORMAL HIGH (ref 15–41)
Albumin: 1.8 g/dL — ABNORMAL LOW (ref 3.5–5.0)
Alkaline Phosphatase: 178 U/L — ABNORMAL HIGH (ref 38–126)
Anion gap: 7 (ref 5–15)
BUN: 5 mg/dL — ABNORMAL LOW (ref 6–20)
CO2: 28 mmol/L (ref 22–32)
Calcium: 7.7 mg/dL — ABNORMAL LOW (ref 8.9–10.3)
Chloride: 99 mmol/L (ref 98–111)
Creatinine, Ser: 0.76 mg/dL (ref 0.61–1.24)
GFR, Estimated: >60 mL/min (ref >60)
Glucose, Bld: 82 mg/dL (ref 70–99)
Potassium: 3.9 mmol/L (ref 3.5–5.1)
Sodium: 134 mmol/L — ABNORMAL LOW (ref 135–145)
Total Bilirubin: 1 mg/dL (ref <1.2)
Total Protein: 6.2 g/dL — ABNORMAL LOW (ref 6.5–8.1)

## 2023-11-30 LAB — CBC
HCT: 32.5 % — ABNORMAL LOW (ref 39.0–52.0)
Hemoglobin: 10.6 g/dL — ABNORMAL LOW (ref 13.0–17.0)
MCH: 26.2 pg (ref 26.0–34.0)
MCHC: 32.6 g/dL (ref 30.0–36.0)
MCV: 80.4 fL (ref 80.0–100.0)
Platelets: 337 10*3/uL (ref 150–400)
RBC: 4.04 MIL/uL — ABNORMAL LOW (ref 4.22–5.81)
RDW: 13.5 % (ref 11.5–15.5)
WBC: 10.7 10*3/uL — ABNORMAL HIGH (ref 4.0–10.5)
nRBC: 0 % (ref 0.0–0.2)

## 2023-11-30 LAB — LEGIONELLA PNEUMOPHILA SEROGP 1 UR AG: L. pneumophila Serogp 1 Ur Ag: NEGATIVE

## 2023-11-30 MED ORDER — AMOXICILLIN-POT CLAVULANATE 875-125 MG PO TABS: 1.0000 | ORAL_TABLET | Freq: Two times a day (BID) | ORAL | 0 refills | Status: AC

## 2023-11-30 NOTE — Consult Note (Cosign Needed Addendum)
[facility] Consult Evaluation  Service Date: November 30, 2023 LOS:  LOS: 3 days    Primary Psychiatric Diagnoses  Opioid (fentanyl) use disorder 2.  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  GAD  Assessment  Brad Evans is a 27 y.o. male admitted medically for 11/27/2023  6:13 PM for acute respiratory failure secondary to pneumonia. 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 Psychiatry was consulted for concern for self harm and due to IVC from family by Dr. Noralee Stain.   His current presentation of substance use, insomnia, anxious thoughts, passive suicidal ideation is most consistent with OUD, MDD.   Regarding substance use, He meets criteria for opiate use disorder based on increasing tolerance, withdrawal, negative effect on social functioning, cravings.  Current outpatient psychotropic medications include methadone 80 mg and he did well on this medication and was prescribed by [facility].  He does report having issues with transportation due to his Zenaida Niece breaking down which have prevented him from getting to appointments but reports that he can reestablished with them on his own.  Patient has been restarted on methadone dose and on initial examination, patient is experiencing minimal symptoms of opioid withdrawal. Please see plan below for detailed recommendations.   Regarding mood and anxiety, he meets criteria for MDD and GAD based on endorsing all symptoms of depression with PHQ-9 of 13 and passive SI and endorsing all symptoms of generalized anxiety with GAD-7 of 10.  He has been prescribed different agents in the past including benzodiazepines. Bupropion and sertraline.  He reports he tried sertraline for couple of months but did not like how it made him feel. Denies explicit sx including weight gain, and sexual dysfunction. Denies wanting  to start SSRIs at this time. Today, patient is not experiencing suicidal/homicidal thoughts. Denied AH/VH. Please see plan below for detailed recommendations.   Regarding substance use, patient meets criteria for opioid use disorder.  Pt reports he is established with [facility] for [facility]adone, [facility] and wants to follow up with them. He is not open to residential treatment, intensive outpatient, visual therapy, NA, or other resources at this time.  Reported that he would like his 80 mg increase but we deferred to [facility] to make this adjustment.  We will coordinate with social work to ensure patient has discharge allowed him to follow-up with [facility].  Regarding IVC written by parents and upheld in ED which addresses concern for safety due to not wanting to pursue medical treatment, this is a commonly accepted reason for IVC in [place] or at [facility] due to lack of medical hold, but does not necessarily indicate need for psych hospitalization after medical hospitalization - bar for involuntary substance treatment is quite high (and involuntary substance use treatment is poorly backed by evidence).  Today, we will reverse IVC based on capacity evaluation below.   Decision being assessed: Leaving AMA; self harming behavior  In an evaluation of capacity, each of the following criteria must be met based for a patient to have capacity to make the decision in question.   Criterion 1: The patient demonstrates a clear and consistent voluntary choice with regard to treatment options. Yes Criterion 2: The patient adequately understands the disease they have, the treatment proposed, the risks of treatment, and the risks of other treatment (including no treatment). Yes Criterion 3: The patient  acknowledges that the details of Criterion 2 apply to them specifically and the likely consequences of treatment options proposed. Yes Criterion 4: The patient demonstrates adequate  reasoning/rationality within the context of their decision and can provide justification for their choice. Yes  In this case, the patient has capacity to decide to leave AMA if he desires.   See patient interview for details. Of note, this capacity evaluation assesses only for the specified decision documented above at the time of the assessment and is not a substitute for determination of the patient's overall competency, which can only be adjudicated.  Diagnoses:  Active Hospital problems: Principal Problem:   Sepsis (HCC) Active Problems:   Acute respiratory failure with hypercapnia (HCC)   Pneumonia of right lower lobe due to infectious organism   GAD (generalized anxiety disorder)   MDD (major depressive disorder), single episode, moderate (HCC)     Plan   ## Psychiatric Medication Recommendations:  --Continue methadone 80 mg once daily  ## Medical Decision Making Capacity:  Patient deemed to have capacity for leaving AMA as detailed above in assessment.  We will reverse IVC which was upheld on the basis of previous safety concern that patient would leave AMA.   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   ## Disposition:  -- There are no psychiatric contraindications to discharge at this time.    ## Behavioral / Environmental:  --   No specific recommendations at this time.  or Utilize compassion and acknowledge the patient's experiences while setting clear and realistic expectations for care.    ## Safety and Observation Level:  - Based on my clinical evaluation, I estimate the patient to be at low risk of self harm in the current setting - At this  time, we recommend a low level of observation. This decision is based on my review of the chart including patient's history and current presentation, interview of the patient, mental status examination, and consideration of suicide risk including evaluating suicidal ideation, plan, intent, suicidal or self-harm behaviors, risk factors, and protective factors. This judgment is based on our ability to directly address suicide risk, implement suicide prevention strategies and develop a safety plan while the patient is in the clinical setting. Please contact our team if there is a concern that risk level has changed.  Suicide risk assessment  Patient has following modifiable risk factors for suicide: under treated depression , which we are addressing by discussing medication use which patient refused. He is open to receiving outpatient care at [facility]   Patient has following non-modifiable or demographic risk factors for suicide: male gender and psychiatric hospitalization  Patient has the following protective factors against suicide: Access to outpatient mental health care, Supportive family, Supportive friends, no history of suicide attempts, and no history of NSSIB   Thank you for this consult request. Recommendations have been communicated to the primary team.  We sign off at this time.   Deforest Hoyles, MS3  I personally was present and performed or re-performed the history, physical exam and medical decision-making activities of this service and have verified that the service and findings are accurately documented in the student's note.  Meryl Dare, MD PGY-1  Psychiatry Resident 11/30/2023, 10:58 AM  Psychiatric and Social History   Relevant Aspects of Hospital Course:  Admitted on 11/27/2023 for acute respiratory failure secondary to pneumonia.  He received BiPAP and was sent to the ICU but is now on the floor and breathing without respiratory support.  He  is being treated for severe sepsis secondary to pneumonia.  Patient Report:  Today patient ports that his chest pain is improving.  He reports that he does not want to leave AMA today.  He does report that he does not want be in the hospital but he understands the risk of leaving the hospital and risk of decompensating if he does not receive full course antibiotics.  He understands that this likely applies to him given that he had severe sepsis.  He has voiced consistent reasoning for his decisions.  Running his mental health conditions, he reports in the past that he has been on sertraline, benzodiazepines, and bupropion.  He reports that the antidepressants have not been useful and that he has "not like the way they feel."  We discussed trialing different antidepressant and he was not open to starting 1 in the hospital.  We discussed if he would want to start behavioral interventions and he reports that he feels there is adequate resources for doing so on his own.  He declined to be established with outpatient psychiatry reports that [facility] all his substance use and other needs.  He denied SI, HI, AVH.  Patient was okay with psych signing off and understands that he can reach out to primary team for [place] to come see him again.  Psych ROS:  Depression: PHQ-9 13.  More than half a days: Feeling down depressed, changes in eating, hopelessness, concentration; several days: Loss of interest, trouble sleep, low energy, psychomotor retardation, passive SI Anxiety: GAD-7 of 10.  Several days: Feeling anxious, unable to control, worrying too much, restlessness.  more have a days: Trouble relaxing, irritability, fear Mania (lifetime and current): Denies any period of several days in which she had decreased need for sleep, increased activity, and changes in mood. Psychosis: (lifetime and current): Denies current.  Reports having paranoia on cannabis.  Parents report he experienced  hallucinations while on cannabis.  Collateral information:  Family was in the room.  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  They report that he was hospitalized when he was 19 due to psychosis secondary to cannabis.  They report concerned about the patient's fentanyl use.   Psychiatric History:  Information collected from patient and family  Prev Dx/Sx: Bipolar which is inaccurate and being removed from the chart, opioid use disorder, substance-induced psychosis Current Psych Provider: None Home Meds (current): Methadone 80 Previous Med Trials: Sertraline 50 with some response, benzodiazepines for sleep Therapy: None  Prior ECT: None Prior Psych Hospitalization: Age 47 for substance use psychosis Prior Self Harm: None Prior Violence: None  Family Psych History: MDD in sibling and grandmother Family Hx suicide: None  Social History:  Developmental Hx: Grew up with supportive family, no significant childhood traumas, had some focusing issues growing up per family, grew up in [place] Educational Hx: Did not assess Occupational Hx: [employer], has not had a steady job in over a year and a half and 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  No current court  dates Living Situation: Living in [place] and out of a motel.  Zenaida Niece became less stable as housing due to mechanical issues. Spiritual Hx: Did not assess Access to weapons: No  Substance History Tobacco use: Denies Alcohol use: Denies Drug use: Endorses fentanyl use   Exam Findings   Psychiatric Specialty Exam:  Presentation  General Appearance: Appropriate for Environment  Eye Contact:Minimal  Speech:Normal Rate  Speech Volume:Normal  Handedness:-- (Not Assessed)   Mood and Affect  Mood:Depressed  Affect:Congruent   Thought Process  Thought  Processes:Linear; Coherent  Descriptions of Associations:Intact  Orientation:-- (Not formally assessed)  Thought Content:Logical  Hallucinations:Hallucinations: None  Ideas of Reference:None  Suicidal Thoughts:Suicidal Thoughts: No  Homicidal Thoughts:Homicidal Thoughts: No   Sensorium  Memory:Immediate Good  Judgment:Good  Insight:Good   Executive Functions  Concentration:-- (Not assessed)  Attention Span:-- (Not assessed)  Recall:-- (Not assessed)  Fund of Knowledge:-- (Not assessed)  Language:-- (Not assessed)   Psychomotor Activity  Psychomotor Activity:Psychomotor Activity: -- (Not assessed)   Assets  Assets:Social Support; Communication Skills   Sleep  Sleep:Sleep: Fair Number of Hours of Sleep: -1    Physical Exam: Vital signs:  Temp:  [98.6 F (37 C)-99.5 F (37.5 C)] 98.6 F (37 C) (12/03 1115) Pulse Rate:  [75-89] 85 (12/03 1115) Resp:  [14-26] 26 (12/03 1115) BP: (132-142)/(81-96) 140/95 (12/03 1115) SpO2:  [91 %-98 %] 97 % (12/03 1115) Physical Exam Vitals and nursing note reviewed.  HENT:     Head: Normocephalic and atraumatic.  Pulmonary:     Effort: Pulmonary effort is normal.  Neurological:     General: No focal deficit present.     Mental Status: He is alert.     Blood pressure (!) 140/95, pulse 85, temperature 98.6 F (37 C), temperature source Oral, resp. rate (!) 26, weight 76 kg, SpO2 97%. Body mass index is 22.72 kg/m.   Other History   These have been pulled in through the EMR, reviewed, and updated if appropriate.   Family History:  MDD in first and second-degree relative  Medical History: Past Medical History:  Diagnosis Date   Brain tumor [facility])    treated as a child    Surgical History: History reviewed. No pertinent surgical history.  Medications:   Current Facility-Administered Medications:    acetaminophen (TYLENOL) tablet 650 mg, 650 mg, Oral, Q4H PRN, Lidia Collum, PA-C, 650 mg at 11/29/23  0542   dextromethorphan-guaiFENesin (MUCINEX DM) 30-600 MG per 12 hr tablet 1 tablet, 1 tablet, Oral, BID PRN, Noralee Stain, DO, 1 tablet at 11/29/23 1805   dicyclomine (BENTYL) tablet 20 mg, 20 mg, Oral, Q6H PRN, Noralee Stain, DO, 20 mg at 11/29/23 1811   docusate sodium (COLACE) capsule 100 mg, 100 mg, Oral, BID PRN, Pia Mau D, PA-C   enoxaparin (LOVENOX) injection 40 mg, 40 mg, Subcutaneous, Q24H, Payne, John D, PA-C, 40 mg at 11/30/23 0454   hydrOXYzine (ATARAX) tablet 25 mg, 25 mg, Oral, Q6H PRN, Noralee Stain, DO, 25 mg at 11/30/23 0549   ipratropium-albuterol (DUONEB) 0.5-2.5 (3) MG/3ML nebulizer solution 3 mL, 3 mL, Nebulization, Q6H PRN, Martina Sinner, MD, 3 mL at 11/29/23 1759   loperamide (IMODIUM) capsule 2-4 mg, 2-4 mg, Oral, PRN, Noralee Stain, DO   methadone (DOLOPHINE) tablet 80 mg, 80 mg, Oral, Daily, Noralee Stain, DO, 80 mg at 11/30/23 0915   methocarbamol (ROBAXIN) tablet 500 mg, 500 mg, Oral, Q8H PRN, Noralee Stain, DO, 500 mg at 11/30/23 0915   naproxen (NAPROSYN) tablet 500  mg, 500 mg, Oral, BID PRN, Noralee Stain, DO   nicotine (NICODERM CQ - dosed in mg/24 hours) patch 14 mg, 14 mg, Transdermal, Daily, Lidia Collum, PA-C, 14 mg at 11/30/23 0916   ondansetron (ZOFRAN) injection 4 mg, 4 mg, Intravenous, Q6H PRN, Larinda Buttery, MD, 4 mg at 11/30/23 0549   Oral care mouth rinse, 15 mL, Mouth Rinse, PRN, Albustami, Flonnie Hailstone, MD   piperacillin-tazobactam (ZOSYN) IVPB 3.375 g, 3.375 g, Intravenous, Q8H, Melody Comas B, MD, Last Rate: 12.5 mL/hr at 11/30/23 0916, 3.375 g at 11/30/23 0916   polyethylene glycol (MIRALAX / GLYCOLAX) packet 17 g, 17 g, Oral, Daily PRN, Suzie Portela, John D, PA-C   senna (SENOKOT) tablet 17.2 mg, 2 tablet, Oral, QHS, Melody Comas B, MD, 17.2 mg at 11/29/23 2159   traMADol (ULTRAM) tablet 50 mg, 50 mg, Oral, Q6H PRN, Larinda Buttery, MD, 50 mg at 11/30/23 0549   traZODone (DESYREL) tablet 50 mg, 50 mg, Oral, QHS PRN, Noralee Stain, DO, 50  mg at 11/29/23 1805  Allergies: No Known Allergies

## 2023-11-30 NOTE — TOC Progression Note (Signed)
Transition of Care [facility]) - Progression Note    Patient Details  Name: QUINT MAGA MRN: [id number] Date of Birth: 09-25-1996  Transition of Care [facility]) CM/SW Contact  Eduard Roux, [place] Phone Number: 11/30/2023, 5:29 PM  Clinical Narrative:     Notice of Commitment Change has been faxed to [facility] of Court- advised the IVC has been rescinded.  Antony Blackbird, MSW, LCSW Clinical Social Worker         Expected Discharge Plan and Services         Expected Discharge Date: 11/30/23                                     Social Determinants of Health (SDOH) Interventions SDOH Screenings   Food Insecurity: Patient Declined (11/29/2023)  Housing: Patient Declined (11/29/2023)  Transportation Needs: Patient Declined (11/29/2023)  Utilities: Patient Declined (11/29/2023)  Tobacco Use: High Risk (11/27/2023)    Readmission Risk Interventions     No data to display

## 2023-11-30 NOTE — Progress Notes (Signed)
O2 91% Room air at rest. O2 90 % Room air while ambulating.

## 2023-11-30 NOTE — Discharge Summary (Addendum)
Physician Discharge Summary  Brad Evans HKV:[id number] DOB: 02/26/1996 DOA: 11/27/2023  PCP: Patient, No Pcp Per  Admit date: 11/27/2023 Discharge date: 11/30/2023  Admitted From: Home Disposition: Home  Recommendations for Outpatient Follow-up:  Follow up with methadone clinic as well as rehab  Discharge Condition: Stable CODE STATUS: Full code Diet recommendation: Regular diet  Brief/Interim Summary: Brad Evans is a 27 year old male with pertinent PMHx brain tumor removed 5 years ago followed by Kateri Mc, bipolar 2 disorder, IV drug use presents [facility] on 11/30 with abdominal pain.   Patient states he has been having lower abdominal pain over the past few days but is significantly worsening and now radiating to the right lower back.  Patient also having a cough, SOB, chest pain described as pleuritic pain, fever, chills.  Patient states he used IV drugs a couple days ago.  Came to [facility] on 11/30 for further eval.  On arrival patient ill-appearing and febrile 102.1 F.  Patient have abdominal tenderness.  Cultures obtained, given IV fluids, started on broad-spectrum antibiotics.  CTA chest/abdomen/pelvis with no PE, RLL consolidation; trace right pleural effusion; bilateral hilar lymphadenopathy; splenomegaly; increased stool burden in cecum and ascending colon likely constipation.  UA clear.  LA 1.  WBC 19.3.  Lipase 13.  Trops flat.  COVID/flu negative.  ABG 7.29, 58, 107, 28.  Patient placed on BiPAP with some improvement ABG.  PCCM consulted for ICU admission.  Patient attempted to leave AGAINST MEDICAL ADVICE 12/1.  He was IVC by family.   Patient was weaned off BiPAP and transferred to [facility] service 12/2.  Patient was seen by psychiatry.  He continued to improve in terms of his respiratory failure and sepsis, pneumonia.  WBC continue to improve.  He was weaned off oxygen.  Discharge Diagnoses:  Principal Problem:   Sepsis (HCC) Active Problems:   Acute  respiratory failure with hypercapnia (HCC)   Pneumonia of right lower lobe due to infectious organism   GAD (generalized anxiety disorder)   MDD (major depressive disorder), single episode, moderate (HCC)   Acute hypoxemic and hypercarbic respiratory failure -Required BiPAP on admission -Currently on room air, SpO2 90% while ambulating   Severe sepsis secondary to CAP -Sepsis present on admission -Vancomycin, Zosyn, azithromycin--> MRSA PCR is negative.  Vanco can be discontinued --> Augmentin for discharge -WBC trended downward to 10.7 today   IV drug abuse -Followed by methadone clinic, [facility].  Discussed with their staff today.  He was last given methadone by their clinic 80 mg 11/29.  He has been intermittently coming into the clinic, treatment started 11/1 -Admitted to using IV fentanyl couple days prior to hospitalization -UDS positive for opiates, cocaine -Methadone resumed, follow-up at methadone clinic -Discussed with psychiatry today  HCV -HCV antibody reactive.  Follow-up with nucleic acid amplification.  Referral sent to infectious disease -HIV NR     Discharge Instructions  Discharge Instructions     Ambulatory referral to Infectious Disease   Complete by: As directed    Call MD for:  difficulty breathing, headache or visual disturbances   Complete by: As directed    Call MD for:  extreme fatigue   Complete by: As directed    Call MD for:  hives   Complete by: As directed    Call MD for:  persistant dizziness or light-headedness   Complete by: As directed    Call MD for:  persistant nausea and vomiting   Complete by: As directed  Call MD for:  severe uncontrolled pain   Complete by: As directed    Call MD for:  temperature >100.4   Complete by: As directed    Diet general   Complete by: As directed    Discharge instructions   Complete by: As directed    You were cared for by a hospitalist during your hospital stay. If you have any questions about  your discharge medications or the care you received while you were in the hospital after you are discharged, you can call the unit and ask to speak with the hospitalist on call if the hospitalist that took care of you is not available. Once you are discharged, your primary care physician will handle any further medical issues. Please note that NO REFILLS for any discharge medications will be authorized once you are discharged, as it is imperative that you return to your primary care physician (or establish a relationship with a primary care physician if you do not have one) for your aftercare needs so that they can reassess your need for medications and monitor your lab values.   Increase activity slowly   Complete by: As directed       Allergies as of 11/30/2023   No Known Allergies      Medication List     STOP taking these medications    buprenorphine-naloxone 8-2 mg Subl SL tablet Commonly known as: SUBOXONE   cloNIDine 0.1 MG tablet Commonly known as: CATAPRES   Flovent HFA 44 MCG/ACT inhaler Generic drug: fluticasone   gabapentin 300 MG capsule Commonly known as: NEURONTIN   ibuprofen 600 MG tablet Commonly known as: ADVIL   ProAir HFA 108 (90 Base) MCG/ACT inhaler Generic drug: albuterol   sertraline 50 MG tablet Commonly known as: ZOLOFT       TAKE these medications    amoxicillin-clavulanate 875-125 MG tablet Commonly known as: AUGMENTIN Take 1 tablet by mouth 2 (two) times daily for 2 days.        Follow-up Information     [place] Follow up.   Contact information: [address] [place] [address] [phone]         [facility]              Follow up.   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 Exam Location:  Inpatient Procedure: 2D Echo,  3D Echo, Color Doppler, Cardiac Doppler and Strain Analysis Indications:    Fever  History:        Patient has no prior history of Echocardiogram examinations.                 Risk Factors:Current Smoker. Brain Tumor.  Sonographer:    Raeford Razor RDCS Referring Phys: [id number] OMAR M ALBUSTAMI IMPRESSIONS  1. Left ventricular ejection fraction, by estimation, is 55 to 60%. The left ventricle has normal function. The left ventricle has no regional wall motion abnormalities. Left ventricular diastolic parameters were normal. The average left ventricular global longitudinal strain is -28.2 %. The global longitudinal strain is normal.  2. Right ventricular systolic function is normal. The right ventricular size is normal. There is moderately elevated pulmonary artery systolic pressure.  3. The mitral valve is normal in structure. Trivial mitral valve regurgitation. No evidence of mitral stenosis.  4. The tricuspid valve is abnormal.  5. The aortic valve is tricuspid. Aortic valve regurgitation is not visualized. No aortic stenosis is present.  6. The inferior vena cava is dilated in size with <50% respiratory variability, suggesting right atrial pressure of 15 mmHg. FINDINGS  Left Ventricle: Left ventricular ejection fraction, by estimation, is 55 to 60%. The left ventricle has normal function. The left ventricle has no regional wall motion abnormalities. The average left ventricular global  longitudinal strain is -28.2 %. The global longitudinal strain is normal. The left ventricular internal cavity size was normal in size. There is no left ventricular hypertrophy. Left ventricular diastolic parameters were normal. Right Ventricle: The right ventricular size is normal. Right vetricular wall thickness was not well visualized. Right ventricular systolic function is normal. There is moderately elevated pulmonary artery systolic pressure. The tricuspid regurgitant velocity is 2.63 m/s, and with an assumed right atrial pressure of 15 mmHg, the estimated right ventricular systolic pressure is 42.7 mmHg. Left Atrium: Left atrial size was normal in size. Right Atrium: Right atrial size was normal in size. Pericardium: There is no evidence of pericardial effusion. Mitral Valve: The mitral valve is normal in structure. Trivial mitral valve regurgitation. No evidence of mitral valve stenosis. Tricuspid Valve: The tricuspid valve is abnormal. Tricuspid valve regurgitation is mild . No evidence of tricuspid stenosis. Aortic Valve: The aortic valve is tricuspid. Aortic valve regurgitation is not visualized. No aortic stenosis is present. Aortic valve mean gradient measures 4.0 mmHg. Aortic valve peak gradient measures 7.7 mmHg. Aortic valve area, by VTI measures 2.16 cm. Pulmonic Valve: The pulmonic valve was not well visualized. Pulmonic valve regurgitation is mild. No evidence of pulmonic stenosis. Aorta: The aortic root is normal in size and structure. Venous: The inferior vena cava is dilated in size with less than 50% respiratory variability, suggesting right atrial pressure of 15 mmHg. IAS/Shunts: No atrial level shunt detected by color flow Doppler.  LEFT VENTRICLE PLAX 2D LVIDd:         5.20 cm   Diastology LVIDs:         3.80 cm   LV e' medial:  16.20 cm/s LV PW:         0.80 cm   LV e' lateral: 23.20 cm/s LV IVS:        0.80 cm LVOT diam:     2.10 cm   2D Longitudinal Strain LV SV:         62        2D  Strain GLS Avg:     -28.2 %  LV SV Index:   32 LVOT Area:     3.46 cm                           3D Volume EF:                          3D EF:        55 %                          LV EDV:       183 ml                          LV ESV:       83 ml                          LV SV:        100 ml RIGHT VENTRICLE             IVC RV Basal diam:  3.10 cm     IVC diam: 2.80 cm RV S prime:     19.60 cm/s TAPSE (M-mode): 2.8 cm LEFT ATRIUM             Index        RIGHT ATRIUM           Index LA diam:        3.20 cm 1.63 cm/m   RA Area:     13.80 cm LA Vol (A2C):   43.8 ml 22.37 ml/m  RA Volume:   34.10 ml  17.41 ml/m LA Vol (A4C):   25.4 ml 12.97 ml/m LA Biplane Vol: 35.9 ml 18.33 ml/m  AORTIC VALVE AV Area (Vmax):    2.44 cm AV Area (Vmean):   2.10 cm AV Area (VTI):     2.16 cm AV Vmax:           139.00 cm/s  PULMONARY ARTERY AV Vmean:          96.200 cm/s  MPA diam:        2.50 cm AV VTI:            0.289 m AV Peak Grad:      7.7 mmHg AV Mean Grad:      4.0 mmHg LVOT Vmax:         98.00 cm/s LVOT Vmean:        58.200 cm/s LVOT VTI:          0.180 m LVOT/AV VTI ratio: 0.62  AORTA Ao Root diam: 3.00 cm Ao Asc diam:  2.60 cm TRICUSPID VALVE TR Peak grad:   27.7 mmHg TR Vmax:        263.00 cm/s  SHUNTS Systemic VTI:  0.18 m Systemic Diam: 2.10 cm Dina Rich MD Electronically signed by Dina Rich MD Signature Date/Time: 11/28/2023/12:41:52 PM    Final    CT ABDOMEN PELVIS W CONTRAST  Result Date: 11/27/2023 CLINICAL DATA:  Cough x 2 weeks, lower back pain and dark urine. Abdominal pain, acute, nonlocalized Fever, abdominal and chest pain, hx IVDU EXAM: CT ANGIOGRAPHY CHEST CT ABDOMEN AND PELVIS WITH CONTRAST TECHNIQUE: Multidetector CT imaging of the chest was performed using the standard protocol during bolus administration of intravenous contrast. Multiplanar CT image reconstructions and MIPs were obtained to evaluate  the vascular anatomy. Multidetector CT imaging of the abdomen and pelvis was performed  using the standard protocol during bolus administration of intravenous contrast. RADIATION DOSE REDUCTION: This exam was performed according to the departmental dose-optimization program which includes automated exposure control, adjustment of the mA and/or kV according to patient size and/or use of iterative reconstruction technique. CONTRAST:  OMNIPAQUE IOHEXOL 350 MG/ML SOLN COMPARISON:  None Available. FINDINGS: CTA CHEST FINDINGS Cardiovascular: Satisfactory opacification of the pulmonary arteries to the segmental level. No evidence of pulmonary embolism. Normal heart size. No significant pericardial effusion. The thoracic aorta is normal in caliber. No atherosclerotic plaque of the thoracic aorta. No coronary artery calcifications. Mediastinum/Nodes: Likely residual thymus within the anterior mediastinum. Enlarged right hilar lymph node measuring 1.6 cm. Enlarged left hilar lymph node measuring 1.2 cm. No mediastinal lymphadenopathy. No axillary lymphadenopathy. Thyroid gland, trachea, and esophagus demonstrate no significant findings. Lungs/Pleura: Heterogeneous right lower lobe consolidation. Some passive atelectasis of the right lower lobe. No pulmonary nodule. No pulmonary mass. Loculated trace to small volume right pleural effusion. Pneumothorax. Musculoskeletal: Bilateral gynecomastia. No suspicious lytic or blastic osseous lesions. No acute displaced fracture. Multilevel degenerative changes of the spine. Review of the MIP images confirms the above findings. CT ABDOMEN and PELVIS FINDINGS Hepatobiliary: No focal liver abnormality. No gallstones, gallbladder wall thickening, or pericholecystic fluid. No biliary dilatation. Pancreas: No focal lesion. Normal pancreatic contour. No surrounding inflammatory changes. No main pancreatic ductal dilatation. Spleen: Spleen is enlarged measuring up to 17 cm. Adrenals/Urinary Tract: No adrenal nodule bilaterally. Bilateral kidneys enhance symmetrically. No  hydronephrosis. No hydroureter. The urinary bladder is unremarkable. Stomach/Bowel: Stomach is within normal limits. Increased stool burden throughout the cecum and ascending colon/hepatic flexure. No evidence of bowel wall thickening or dilatation. Appendix appears normal. Vascular/Lymphatic: No abdominal aorta or iliac aneurysm. No abdominal, pelvic, or inguinal lymphadenopathy. Reproductive: Prostate is unremarkable. Other: No intraperitoneal free fluid. No intraperitoneal free gas. No organized fluid collection. Musculoskeletal: No abdominal wall hernia or abnormality. No suspicious lytic or blastic osseous lesions. No acute displaced fracture. Review of the MIP images confirms the above findings. IMPRESSION: 1. No pulmonary embolus. 2. Heterogeneous right lower lobe consolidation suggestive of infection with no definite abscess formation. 3. Loculated trace to small volume right pleural effusion. 4. Bilateral hilar lymphadenopathy may be reactive in etiology. 5. Splenomegaly. Finding could be due to lymphoproliferative disorder. 6. Increased stool burden throughout the cecum and ascending colon. Correlate constipation. Electronically Signed   By: Tish Frederickson M.D.   On: 11/27/2023 20:42   CT Angio Chest PE W/Cm &/Or Wo Cm  Result Date: 11/27/2023 CLINICAL DATA:  Cough x 2 weeks, lower back pain and dark urine. Abdominal pain, acute, nonlocalized Fever, abdominal and chest pain, hx IVDU EXAM: CT ANGIOGRAPHY CHEST CT ABDOMEN AND PELVIS WITH CONTRAST TECHNIQUE: Multidetector CT imaging of the chest was performed using the standard protocol during bolus administration of intravenous contrast. Multiplanar CT image reconstructions and MIPs were obtained to evaluate the vascular anatomy. Multidetector CT imaging of the abdomen and pelvis was performed using the standard protocol during bolus administration of intravenous contrast. RADIATION DOSE REDUCTION: This exam was performed according to the departmental  dose-optimization program which includes automated exposure control, adjustment of the mA and/or kV according to patient size and/or use of iterative reconstruction technique. CONTRAST:  OMNIPAQUE IOHEXOL 350 MG/ML SOLN COMPARISON:  None Available. FINDINGS: CTA CHEST FINDINGS Cardiovascular: Satisfactory opacification of the pulmonary arteries to the segmental level. No evidence of pulmonary  embolism. Normal heart size. No significant pericardial effusion. The thoracic aorta is normal in caliber. No atherosclerotic plaque of the thoracic aorta. No coronary artery calcifications. Mediastinum/Nodes: Likely residual thymus within the anterior mediastinum. Enlarged right hilar lymph node measuring 1.6 cm. Enlarged left hilar lymph node measuring 1.2 cm. No mediastinal lymphadenopathy. No axillary lymphadenopathy. Thyroid gland, trachea, and esophagus demonstrate no significant findings. Lungs/Pleura: Heterogeneous right lower lobe consolidation. Some passive atelectasis of the right lower lobe. No pulmonary nodule. No pulmonary mass. Loculated trace to small volume right pleural effusion. Pneumothorax. Musculoskeletal: Bilateral gynecomastia. No suspicious lytic or blastic osseous lesions. No acute displaced fracture. Multilevel degenerative changes of the spine. Review of the MIP images confirms the above findings. CT ABDOMEN and PELVIS FINDINGS Hepatobiliary: No focal liver abnormality. No gallstones, gallbladder wall thickening, or pericholecystic fluid. No biliary dilatation. Pancreas: No focal lesion. Normal pancreatic contour. No surrounding inflammatory changes. No main pancreatic ductal dilatation. Spleen: Spleen is enlarged measuring up to 17 cm. Adrenals/Urinary Tract: No adrenal nodule bilaterally. Bilateral kidneys enhance symmetrically. No hydronephrosis. No hydroureter. The urinary bladder is unremarkable. Stomach/Bowel: Stomach is within normal limits. Increased stool burden throughout the cecum  and ascending colon/hepatic flexure. No evidence of bowel wall thickening or dilatation. Appendix appears normal. Vascular/Lymphatic: No abdominal aorta or iliac aneurysm. No abdominal, pelvic, or inguinal lymphadenopathy. Reproductive: Prostate is unremarkable. Other: No intraperitoneal free fluid. No intraperitoneal free gas. No organized fluid collection. Musculoskeletal: No abdominal wall hernia or abnormality. No suspicious lytic or blastic osseous lesions. No acute displaced fracture. Review of the MIP images confirms the above findings. IMPRESSION: 1. No pulmonary embolus. 2. Heterogeneous right lower lobe consolidation suggestive of infection with no definite abscess formation. 3. Loculated trace to small volume right pleural effusion. 4. Bilateral hilar lymphadenopathy may be reactive in etiology. 5. Splenomegaly. Finding could be due to lymphoproliferative disorder. 6. Increased stool burden throughout the cecum and ascending colon. Correlate constipation. Electronically Signed   By: Tish Frederickson M.D.   On: 11/27/2023 20:42      Discharge Exam: Vitals:   11/30/23 1115 11/30/23 1231  BP: (!) 140/95 (!) 141/89  Pulse: 85 87  Resp: (!) 26 (!) 23  Temp: 98.6 F (37 C) 98.6 F (37 C)  SpO2: 97% 94%    General: Pt is alert, awake, not in acute distress Cardiovascular: RRR, S1/S2 +, no edema Respiratory: Without distress, diminished bases Abdominal: Soft, NT, ND, bowel sounds + Extremities: no edema, no cyanosis Psych: Normal mood and affect   The results of significant diagnostics from this hospitalization (including imaging, microbiology, ancillary and laboratory) are listed below for reference.     Microbiology: Recent Results (from the past 240 hour(s))  Culture, blood (Routine x 2)     Status: None (Preliminary result)   Collection Time: 11/27/23  6:30 PM   Specimen: BLOOD  Result Value Ref Range Status   Specimen Description   Final    BLOOD LEFT ANTECUBITAL Performed at  [facility], [address], [place], [place] [address]    Special Requests   Final    BOTTLES DRAWN AEROBIC AND ANAEROBIC Blood Culture adequate volume Performed at [facility], [address] [place], [place] [address]    Culture   Final    NO GROWTH 3 DAYS Performed at [facility], [address] [place], [place] [address]    Report Status PENDING  Incomplete  Culture, blood (Routine x 2)     Status: None (Preliminary  result)   Collection Time: 11/27/23  6:34 PM   Specimen: BLOOD  Result Value Ref Range Status   Specimen Description   Final    BLOOD BLOOD LEFT FOREARM Performed at [facility], [address], [place], [place] [address]    Special Requests   Final    AEROBIC BOTTLE ONLY Blood Culture adequate volume Performed at [facility], [address] [place], [place] [address]    Culture   Final    NO GROWTH 3 DAYS Performed at [facility], [address] [place], [place] [address]    Report Status PENDING  Incomplete  Resp panel by RT-PCR (RSV, Flu A&B, Covid) Anterior Nasal Swab     Status: None   Collection Time: 11/27/23  6:50 PM   Specimen: Anterior Nasal Swab  Result Value Ref Range Status   SARS Coronavirus 2 by RT PCR NEGATIVE NEGATIVE Final    Comment: (NOTE) SARS-CoV-2 target nucleic acids are NOT DETECTED.  The SARS-CoV-2 RNA is generally detectable in upper respiratory specimens during the acute phase of infection. The lowest concentration of SARS-CoV-2 viral copies this assay can detect is 138 copies/mL. A negative result does not preclude SARS-Cov-2 infection and should not be used as the sole basis for treatment or other patient management decisions. A negative result may occur with  improper specimen collection/handling, submission of specimen other than nasopharyngeal swab, presence of viral mutation(s) within the areas targeted by this assay, and  inadequate number of viral copies(<138 copies/mL). A negative result must be combined with clinical observations, patient history, and epidemiological information. The expected result is Negative.  Fact Sheet for Patients:  BloggerCourse.com  Fact Sheet for Healthcare Providers:  SeriousBroker.it  This test is no t yet approved or cleared by the [place] FDA and  has been authorized for detection and/or diagnosis of SARS-CoV-2 by FDA under an Emergency Use Authorization (EUA). This EUA will remain  in effect (meaning this test can be used) for the duration of the COVID-19 declaration under Section 564(b)(1) of the Act, 21 U.S.C.section 360bbb-3(b)(1), unless the authorization is terminated  or revoked sooner.       Influenza A by PCR NEGATIVE NEGATIVE Final   Influenza B by PCR NEGATIVE NEGATIVE Final    Comment: (NOTE) The Xpert Xpress SARS-CoV-2/FLU/RSV plus assay is intended as an aid in the diagnosis of influenza from Nasopharyngeal swab specimens and should not be used as a sole basis for treatment. Nasal washings and aspirates are unacceptable for Xpert Xpress SARS-CoV-2/FLU/RSV testing.  Fact Sheet for Patients: BloggerCourse.com  Fact Sheet for Healthcare Providers: SeriousBroker.it  This test is not yet approved or cleared by the [place] FDA and has been authorized for detection and/or diagnosis of SARS-CoV-2 by FDA under an Emergency Use Authorization (EUA). This EUA will remain in effect (meaning this test can be used) for the duration of the COVID-19 declaration under Section 564(b)(1) of the Act, 21 U.S.C. section 360bbb-3(b)(1), unless the authorization is terminated or revoked.     Resp Syncytial Virus by PCR NEGATIVE NEGATIVE Final    Comment: (NOTE) Fact Sheet for Patients: BloggerCourse.com  Fact Sheet for Healthcare  Providers: SeriousBroker.it  This test is not yet approved or cleared by the [place] FDA and has been authorized for detection and/or diagnosis of SARS-CoV-2 by FDA under an Emergency Use Authorization (EUA). This EUA will remain in effect (meaning this test can be used) for the duration of the COVID-19 declaration under  Section 564(b)(1) of the Act, 21 U.S.C. section 360bbb-3(b)(1), unless the authorization is terminated or revoked.  Performed at [facility], [address], [place], [place] [address]   MRSA Next Gen by PCR, Nasal     Status: None   Collection Time: 11/27/23 11:45 PM   Specimen: Nasal Mucosa; Nasal Swab  Result Value Ref Range Status   MRSA by PCR Next Gen NOT DETECTED NOT DETECTED Final    Comment: (NOTE) The GeneXpert MRSA Assay (FDA approved for NASAL specimens only), is one component of a comprehensive MRSA colonization surveillance program. It is not intended to diagnose MRSA infection nor to guide or monitor treatment for MRSA infections. Test performance is not FDA approved in patients less than 3 years old. Performed at [facility], [address] [place], [place] [address]   Respiratory (~20 pathogens) panel by PCR     Status: None   Collection Time: 11/27/23 11:45 PM   Specimen: Nasopharyngeal Swab; Respiratory  Result Value Ref Range Status   Adenovirus NOT DETECTED NOT DETECTED Final   Coronavirus 229E NOT DETECTED NOT DETECTED Final    Comment: (NOTE) The Coronavirus on the Respiratory Panel, DOES NOT test for the novel  Coronavirus (2019 nCoV)    Coronavirus HKU1 NOT DETECTED NOT DETECTED Final   Coronavirus NL63 NOT DETECTED NOT DETECTED Final   Coronavirus OC43 NOT DETECTED NOT DETECTED Final   Metapneumovirus NOT DETECTED NOT DETECTED Final   Rhinovirus / Enterovirus NOT DETECTED NOT DETECTED Final   Influenza A NOT DETECTED NOT DETECTED Final   Influenza B NOT DETECTED NOT  DETECTED Final   Parainfluenza Virus 1 NOT DETECTED NOT DETECTED Final   Parainfluenza Virus 2 NOT DETECTED NOT DETECTED Final   Parainfluenza Virus 3 NOT DETECTED NOT DETECTED Final   Parainfluenza Virus 4 NOT DETECTED NOT DETECTED Final   Respiratory Syncytial Virus NOT DETECTED NOT DETECTED Final   Bordetella pertussis NOT DETECTED NOT DETECTED Final   Bordetella Parapertussis NOT DETECTED NOT DETECTED Final   Chlamydophila pneumoniae NOT DETECTED NOT DETECTED Final   Mycoplasma pneumoniae NOT DETECTED NOT DETECTED Final    Comment: Performed at [facility], [address], [place], [place] [address]     Labs: BNP (last 3 results) No results for input(s): "BNP" in the last 8760 hours. Basic Metabolic Panel: Recent Labs  Lab 11/27/23 1830 11/27/23 1938 11/27/23 2058 11/27/23 2127 11/28/23 0235 11/29/23 1002 11/30/23 0334  NA 130* 131*  --  131* 132* 137 134*  K 4.7 4.5  --  4.6 5.7* 4.1 3.9  CL 91*  --   --   --  97* 96* 99  CO2 31  --   --   --  27 31 28   GLUCOSE 114*  --   --   --  134* 112* 82  BUN 21*  --   --   --  13 6 5*  CREATININE 1.09  --   --   --  0.80 0.72 0.76  CALCIUM 9.8  --   --   --  8.3* 8.5* 7.7*  MG  --   --  1.2*  --   --   --   --    Liver Function Tests: Recent Labs  Lab 11/27/23 1830 11/28/23 0235 11/30/23 0334  AST 98* 86* 52*  ALT 98* 78* 57*  ALKPHOS 190* 168* 178*  BILITOT 1.5* 2.0* 1.0  PROT 9.1* 6.6 6.2*  ALBUMIN 3.7 2.2* 1.8*  Recent Labs  Lab 11/27/23 1830  LIPASE 13   Recent Labs  Lab 11/28/23 0209  AMMONIA 40*   CBC: Recent Labs  Lab 11/27/23 1830 11/27/23 1938 11/27/23 2127 11/28/23 0437 11/29/23 1002 11/30/23 0334  WBC 19.3*  --   --  16.9* 12.7* 10.7*  NEUTROABS 15.7*  --   --   --   --   --   HGB 13.0 10.9* 10.9* 10.0* 12.2* 10.6*  HCT 39.8 32.0* 32.0* 31.5* 38.4* 32.5*  MCV 82.7  --   --  81.2 82.8 80.4  PLT 363  --   --  265 367 337   Cardiac Enzymes: No results for input(s): "CKTOTAL",  "CKMB", "CKMBINDEX", "TROPONINI" in the last 168 hours. BNP: Invalid input(s): "POCBNP" CBG: Recent Labs  Lab 11/27/23 2359  GLUCAP 153*   D-Dimer No results for input(s): "DDIMER" in the last 72 hours. Hgb A1c No results for input(s): "HGBA1C" in the last 72 hours. Lipid Profile No results for input(s): "CHOL", "HDL", "LDLCALC", "TRIG", "CHOLHDL", "LDLDIRECT" in the last 72 hours. Thyroid function studies No results for input(s): "TSH", "T4TOTAL", "T3FREE", "THYROIDAB" in the last 72 hours.  Invalid input(s): "FREET3" Anemia work up Recent Labs    11/28/23 0235 11/28/23 0437  VITAMINB12  --  425  FOLATE 7.8  --   FERRITIN  --  161  TIBC  --  199*  IRON  --  18*   Urinalysis    Component Value Date/Time   COLORURINE YELLOW 11/27/2023 1829   APPEARANCEUR CLEAR 11/27/2023 1829   LABSPEC 1.032 (H) 11/27/2023 1829   PHURINE 5.5 11/27/2023 1829   GLUCOSEU NEGATIVE 11/27/2023 1829   HGBUR NEGATIVE 11/27/2023 1829   BILIRUBINUR NEGATIVE 11/27/2023 1829   KETONESUR NEGATIVE 11/27/2023 1829   PROTEINUR 30 (A) 11/27/2023 1829   NITRITE NEGATIVE 11/27/2023 1829   LEUKOCYTESUR NEGATIVE 11/27/2023 1829   Sepsis Labs Recent Labs  Lab 11/27/23 1830 11/28/23 0437 11/29/23 1002 11/30/23 0334  WBC 19.3* 16.9* 12.7* 10.7*   Microbiology Recent Results (from the past 240 hour(s))  Culture, blood (Routine x 2)     Status: None (Preliminary result)   Collection Time: 11/27/23  6:30 PM   Specimen: BLOOD  Result Value Ref Range Status   Specimen Description   Final    BLOOD LEFT ANTECUBITAL Performed at [facility], [address] [place], [place] [address]    Special Requests   Final    BOTTLES DRAWN AEROBIC AND ANAEROBIC Blood Culture adequate volume Performed at [facility], [address], [place], [place] [address]    Culture   Final    NO GROWTH 3 DAYS Performed at [facility], [address] [place], [place]  [address]    Report Status PENDING  Incomplete  Culture, blood (Routine x 2)     Status: None (Preliminary result)   Collection Time: 11/27/23  6:34 PM   Specimen: BLOOD  Result Value Ref Range Status   Specimen Description   Final    BLOOD BLOOD LEFT FOREARM Performed at [facility], [address], [place], [place] [address]    Special Requests   Final    AEROBIC BOTTLE ONLY Blood Culture adequate volume Performed at [facility], [address], [place], [place] [address]    Culture   Final    NO GROWTH 3 DAYS Performed at [facility], [address] [place], [place] [address]    Report Status PENDING  Incomplete  Resp panel by RT-PCR (RSV, Flu A&B, Covid) Anterior Nasal Swab     Status: None   Collection Time: 11/27/23  6:50 PM   Specimen: Anterior Nasal Swab  Result Value Ref Range Status   SARS Coronavirus 2 by RT PCR NEGATIVE NEGATIVE Final    Comment: (NOTE) SARS-CoV-2 target nucleic acids are NOT DETECTED.  The SARS-CoV-2 RNA is generally detectable in upper respiratory specimens during the acute phase of infection. The lowest concentration of SARS-CoV-2 viral copies this assay can detect is 138 copies/mL. A negative result does not preclude SARS-Cov-2 infection and should not be used as the sole basis for treatment or other patient management decisions. A negative result may occur with  improper specimen collection/handling, submission of specimen other than nasopharyngeal swab, presence of viral mutation(s) within the areas targeted by this assay, and inadequate number of viral copies(<138 copies/mL). A negative result must be combined with clinical observations, patient history, and epidemiological information. The expected result is Negative.  Fact Sheet for Patients:  BloggerCourse.com  Fact Sheet for Healthcare Providers:  SeriousBroker.it  This test is no t yet  approved or cleared by the [place] FDA and  has been authorized for detection and/or diagnosis of SARS-CoV-2 by FDA under an Emergency Use Authorization (EUA). This EUA will remain  in effect (meaning this test can be used) for the duration of the COVID-19 declaration under Section 564(b)(1) of the Act, 21 U.S.C.section 360bbb-3(b)(1), unless the authorization is terminated  or revoked sooner.       Influenza A by PCR NEGATIVE NEGATIVE Final   Influenza B by PCR NEGATIVE NEGATIVE Final    Comment: (NOTE) The Xpert Xpress SARS-CoV-2/FLU/RSV plus assay is intended as an aid in the diagnosis of influenza from Nasopharyngeal swab specimens and should not be used as a sole basis for treatment. Nasal washings and aspirates are unacceptable for Xpert Xpress SARS-CoV-2/FLU/RSV testing.  Fact Sheet for Patients: BloggerCourse.com  Fact Sheet for Healthcare Providers: SeriousBroker.it  This test is not yet approved or cleared by the [place] FDA and has been authorized for detection and/or diagnosis of SARS-CoV-2 by FDA under an Emergency Use Authorization (EUA). This EUA will remain in effect (meaning this test can be used) for the duration of the COVID-19 declaration under Section 564(b)(1) of the Act, 21 U.S.C. section 360bbb-3(b)(1), unless the authorization is terminated or revoked.     Resp Syncytial Virus by PCR NEGATIVE NEGATIVE Final    Comment: (NOTE) Fact Sheet for Patients: BloggerCourse.com  Fact Sheet for Healthcare Providers: SeriousBroker.it  This test is not yet approved or cleared by the [place] FDA and has been authorized for detection and/or diagnosis of SARS-CoV-2 by FDA under an Emergency Use Authorization (EUA). This EUA will remain in effect (meaning this test can be used) for the duration of the COVID-19 declaration under Section 564(b)(1) of  the Act, 21 U.S.C. section 360bbb-3(b)(1), unless the authorization is terminated or revoked.  Performed at [facility], [address] [place], [place] [address]   MRSA Next Gen by PCR, Nasal     Status: None   Collection Time: 11/27/23 11:45 PM   Specimen: Nasal Mucosa; Nasal Swab  Result Value Ref Range Status   MRSA by PCR Next Gen NOT DETECTED NOT DETECTED Final    Comment: (NOTE) The GeneXpert MRSA Assay (FDA approved for NASAL specimens only), is one component of a comprehensive MRSA colonization surveillance program. It is not intended to diagnose MRSA infection nor  to guide or monitor treatment for MRSA infections. Test performance is not FDA approved in patients less than 15 years old. Performed at [facility], [address], [place], [place] [address]   Respiratory (~20 pathogens) panel by PCR     Status: None   Collection Time: 11/27/23 11:45 PM   Specimen: Nasopharyngeal Swab; Respiratory  Result Value Ref Range Status   Adenovirus NOT DETECTED NOT DETECTED Final   Coronavirus 229E NOT DETECTED NOT DETECTED Final    Comment: (NOTE) The Coronavirus on the Respiratory Panel, DOES NOT test for the novel  Coronavirus (2019 nCoV)    Coronavirus HKU1 NOT DETECTED NOT DETECTED Final   Coronavirus NL63 NOT DETECTED NOT DETECTED Final   Coronavirus OC43 NOT DETECTED NOT DETECTED Final   Metapneumovirus NOT DETECTED NOT DETECTED Final   Rhinovirus / Enterovirus NOT DETECTED NOT DETECTED Final   Influenza A NOT DETECTED NOT DETECTED Final   Influenza B NOT DETECTED NOT DETECTED Final   Parainfluenza Virus 1 NOT DETECTED NOT DETECTED Final   Parainfluenza Virus 2 NOT DETECTED NOT DETECTED Final   Parainfluenza Virus 3 NOT DETECTED NOT DETECTED Final   Parainfluenza Virus 4 NOT DETECTED NOT DETECTED Final   Respiratory Syncytial Virus NOT DETECTED NOT DETECTED Final   Bordetella pertussis NOT DETECTED NOT DETECTED Final   Bordetella  Parapertussis NOT DETECTED NOT DETECTED Final   Chlamydophila pneumoniae NOT DETECTED NOT DETECTED Final   Mycoplasma pneumoniae NOT DETECTED NOT DETECTED Final    Comment: Performed at [facility], [address], [place], [place] [address]     Patient was seen and examined on the day of discharge and was found to be in stable condition. Time coordinating discharge: 40 minutes including assessment and coordination of care, as well as examination of the patient.   SIGNED:  Noralee Stain, DO [facility] 11/30/2023, 3:21 PM

## 2023-12-01 DIAGNOSIS — F1121 Opioid dependence, in remission: Secondary | ICD-10-CM

## 2023-12-02 LAB — CULTURE, BLOOD (ROUTINE X 2)
Culture: NO GROWTH
Special Requests: ADEQUATE
Special Requests: ADEQUATE

## 2023-12-02 LAB — HCV RNA, QUANTITATIVE REFLEX
HCV RNA - Quantitation: 25500000 [IU]/mL
HCV RNA - log10: 7.407 {Log_IU}/mL

## 2023-12-02 LAB — HCV RNA DIAGNOSIS, NAA

## 2023-12-09 ENCOUNTER — Telehealth: Payer: Self-pay

## 2023-12-09 NOTE — Telephone Encounter (Signed)
Patient called office to schedule new patient appointment for Hep C treatment. Is worried about some symptoms he is experiencing since discharging from hospital. Is having abdominal pain and diarrhea. Would like to know if there is something that he can take over the counter to help with symptoms.  Advised patient to try treating symptoms with over the counter medication to help with diarrhea. Also to stay hydrated. Advised patient to also contact PCP regarding recent discharge for follow up. Secure chat sent to referral coordinator to schedule new patient appt. Patient is expected to be admitted at rehab for two weeks. Is not sure when this will happen.  Juanita Laster, RMA

## 2023-12-16 DIAGNOSIS — F1120 Opioid dependence, uncomplicated: Secondary | ICD-10-CM | POA: Insufficient documentation

## 2023-12-23 ENCOUNTER — Telehealth: Payer: Self-pay

## 2023-12-23 ENCOUNTER — Other Ambulatory Visit (HOSPITAL_COMMUNITY): Payer: Self-pay

## 2023-12-23 NOTE — Telephone Encounter (Signed)
RC[facility] Patient Product/process development scientist completed.    The patient is insured through [employer] and has a $4.00 copay.  We will continue to follow to see if copay assistance is needed.  Clearance Coots, CPhT Specialty Pharmacy Patient [facility] Phone: [phone] Fax:  [phone]

## 2024-01-04 ENCOUNTER — Encounter: Payer: MEDICAID | Admitting: Internal Medicine

## 2024-01-07 ENCOUNTER — Other Ambulatory Visit: Payer: Self-pay

## 2024-01-07 ENCOUNTER — Ambulatory Visit (INDEPENDENT_AMBULATORY_CARE_PROVIDER_SITE_OTHER): Payer: MEDICAID | Admitting: Internal Medicine

## 2024-01-07 ENCOUNTER — Encounter: Payer: Self-pay | Admitting: Internal Medicine

## 2024-01-07 VITALS — BP 138/84 | HR 86 | Resp 16 | Ht 72.0 in | Wt 170.0 lb

## 2024-01-07 DIAGNOSIS — L309 Dermatitis, unspecified: Secondary | ICD-10-CM | POA: Insufficient documentation

## 2024-01-07 DIAGNOSIS — B182 Chronic viral hepatitis C: Secondary | ICD-10-CM | POA: Diagnosis not present

## 2024-01-07 DIAGNOSIS — M869 Osteomyelitis, unspecified: Secondary | ICD-10-CM | POA: Insufficient documentation

## 2024-01-07 NOTE — Progress Notes (Signed)
 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 male referred here for hcv infection  Chart reviewed  Patient ivdu and last use a month ago. 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    Review of Systems: ROS All other ros negative      Past Medical History:  Diagnosis Date   Brain tumor (HCC)    treated as a child    Social History   Tobacco Use   Smoking status: Every Day    Current packs/day: 0.50    Average packs/day: 0.5 packs/day for 10.0 years (5.0 ttl pk-yrs)    Types: Cigarettes    Start date: 12/28/2013    Passive exposure: Never  Vaping  Use   Vaping status: Every Day   Substances: Nicotine , Flavoring  Substance Use Topics   Alcohol use: Not Currently   Drug use: Not Currently    Frequency: 7.0 times per week    Comment: fentanyl and meth last used 30 days ago.    History reviewed. No pertinent family history.  No Known Allergies  OBJECTIVE: Vitals:   01/07/24 0839  BP: 138/84  Pulse: 86  Resp: 16  Weight: 170 lb (77.1 kg)  Height: 6' (1.829 m)   Body mass index is 23.06 kg/m.   Physical Exam General/constitutional: no distress; avoiding eye contacts throughout exam HEENT: Normocephalic, PER, Conj Clear, EOMI, Oropharynx clear Neck supple CV: rrr no mrg Lungs: clear to auscultation, normal respiratory effort Abd: Soft, Nontender Ext: no edema Skin: No Rash Neuro: nonfocal MSK: no peripheral joint swelling/tenderness/warmth; back spines nontender  Lab: Lab Results  Component Value Date   WBC 10.7 (H) 11/30/2023   HGB 10.6 (L) 11/30/2023   HCT 32.5 (L) 11/30/2023   MCV 80.4 11/30/2023   PLT 337 11/30/2023   Last metabolic panel Lab Results  Component Value Date   GLUCOSE 82 11/30/2023   NA 134 (L) 11/30/2023   K 3.9 11/30/2023   CL 99 11/30/2023   CO2 28 11/30/2023   BUN 5 (L) 11/30/2023   CREATININE 0.76 11/30/2023   GFRNONAA >60 11/30/2023   CALCIUM 7.7 (  L) 11/30/2023   PROT 6.2 (L) 11/30/2023   ALBUMIN 1.8 (L) 11/30/2023   BILITOT 1.0 11/30/2023   ALKPHOS 178 (H) 11/30/2023   AST 52 (H) 11/30/2023   ALT 57 (H) 11/30/2023   ANIONGAP 7 11/30/2023    Microbiology:  Serology:  Imaging:   Assessment/plan: Problem List Items Addressed This Visit   None Visit Diagnoses       Chronic hepatitis C with hepatic coma (HCC)    -  Primary     Chronic hepatitis C without hepatic coma (HCC)       Relevant Orders   HCV RNA quant   Hepatitis C genotype   Comprehensive metabolic panel   CBC w/Diff   Hepatitis B surface antigen   Hepatitis B core antibody, total   HIV antibody  (with reflex)   Hepatitis C RNA quantitative         28 yo male hx brain tumor s/p resection 2020 at [facility], ivdu, bipolar, recent 11/27/23 admission for sepsis/abd pain tx for pna, positive hcv ab then referred here for that  #hep c ab positive #ivdu #methadone  replacement Rna unit and positive abx 11/28/23 Hiv screen negative 11/28/23 Hep b screen negative 11/28/23 He said he was tested for hep c earlier last year and was negative He last used iv fentanyl a month ago and is currently on methadone   No rash/joint pain/malaise at this time Discuss possibility of acute hep c infection recently and will repeat labs today and in 6 months before further evaluation -- 25% hcv infection is acute/self limited  Encourage ongoing drug use abstinence  Repeat hep b and hiv testing today   F/u 6 months -- sooner potentially to start hep b vaccination if needed  Other hep c counseling: -discussed natural progression of hep c, transmission (avoid sharing personal hygiene equipment) -discussed avoid toxin like etoh and excessive acetamaminphen (no more than 2 gram a day) -discussed healthy life style and good glucose control -discussed avoiding eating raw sea food -discussed we can treat hep c but can be reinfected -discussed hepatitis coinfection and vaccination   Follow-up: Return in about 6 months (around 07/06/2024).  Brad ONEIDA Passer, MD [facility] 01/07/2024, 9:13 AM

## 2024-01-12 LAB — COMPREHENSIVE METABOLIC PANEL
AG Ratio: 1 (calc) (ref 1.0–2.5)
ALT: 155 U/L — ABNORMAL HIGH (ref 9–46)
AST: 79 U/L — ABNORMAL HIGH (ref 10–40)
Albumin: 4.5 g/dL (ref 3.6–5.1)
Alkaline phosphatase (APISO): 173 U/L — ABNORMAL HIGH (ref 36–130)
BUN: 13 mg/dL (ref 7–25)
CO2: 29 mmol/L (ref 20–32)
Calcium: 10.4 mg/dL — ABNORMAL HIGH (ref 8.6–10.3)
Chloride: 100 mmol/L (ref 98–110)
Creat: 0.88 mg/dL (ref 0.60–1.24)
Globulin: 4.3 g/dL — ABNORMAL HIGH (ref 1.9–3.7)
Glucose, Bld: 93 mg/dL (ref 65–99)
Potassium: 4.2 mmol/L (ref 3.5–5.3)
Sodium: 139 mmol/L (ref 135–146)
Total Bilirubin: 0.8 mg/dL (ref 0.2–1.2)
Total Protein: 8.8 g/dL — ABNORMAL HIGH (ref 6.1–8.1)

## 2024-01-12 LAB — HEPATITIS C GENOTYPE

## 2024-01-12 LAB — CBC WITH DIFFERENTIAL/PLATELET
Absolute Lymphocytes: 1675 {cells}/uL (ref 850–3900)
Absolute Monocytes: 442 {cells}/uL (ref 200–950)
Basophils Absolute: 29 {cells}/uL (ref 0–200)
Basophils Relative: 0.6 %
Eosinophils Absolute: 192 {cells}/uL (ref 15–500)
Eosinophils Relative: 4 %
HCT: 42 % (ref 38.5–50.0)
Hemoglobin: 14.1 g/dL (ref 13.2–17.1)
MCH: 27.3 pg (ref 27.0–33.0)
MCHC: 33.6 g/dL (ref 32.0–36.0)
MCV: 81.2 fL (ref 80.0–100.0)
MPV: 9.5 fL (ref 7.5–12.5)
Monocytes Relative: 9.2 %
Neutro Abs: 2462 {cells}/uL (ref 1500–7800)
Neutrophils Relative %: 51.3 %
Platelets: 312 10*3/uL (ref 140–400)
RBC: 5.17 10*6/uL (ref 4.20–5.80)
RDW: 15.4 % — ABNORMAL HIGH (ref 11.0–15.0)
Total Lymphocyte: 34.9 %
WBC: 4.8 10*3/uL (ref 3.8–10.8)

## 2024-01-12 LAB — HEPATITIS B SURFACE ANTIGEN: Hepatitis B Surface Ag: NONREACTIVE

## 2024-01-12 LAB — HEPATITIS B CORE ANTIBODY, TOTAL: Hep B Core Total Ab: NONREACTIVE

## 2024-01-12 LAB — HEPATITIS C RNA QUANTITATIVE
HCV Quantitative Log: 3.45 {Log} — ABNORMAL HIGH
HCV RNA, PCR, QN: 2790 [IU]/mL — ABNORMAL HIGH

## 2024-01-12 LAB — HIV ANTIBODY (ROUTINE TESTING W REFLEX): HIV 1&2 Ab, 4th Generation: NONREACTIVE

## 2024-01-26 ENCOUNTER — Telehealth: Payer: Self-pay

## 2024-01-26 NOTE — Telephone Encounter (Signed)
Patient called requesting lab results.     Brad Evans, CMA

## 2024-01-28 NOTE — Telephone Encounter (Signed)
Patient aware and scheduled for follow up on 06/29/24.

## 2024-06-29 ENCOUNTER — Ambulatory Visit: Payer: MEDICAID | Admitting: Internal Medicine

## 2024-06-29 ENCOUNTER — Other Ambulatory Visit: Payer: Self-pay

## 2024-06-29 ENCOUNTER — Encounter: Payer: Self-pay | Admitting: Internal Medicine

## 2024-06-29 VITALS — BP 114/81 | HR 72 | Temp 98.7°F | Wt 170.0 lb

## 2024-06-29 DIAGNOSIS — Z23 Encounter for immunization: Secondary | ICD-10-CM | POA: Diagnosis not present

## 2024-06-29 DIAGNOSIS — B182 Chronic viral hepatitis C: Secondary | ICD-10-CM | POA: Diagnosis not present

## 2024-06-29 DIAGNOSIS — Z1159 Encounter for screening for other viral diseases: Secondary | ICD-10-CM

## 2024-06-29 NOTE — Patient Instructions (Signed)
 We are checking hep c level today to see if your body has gotten rid of it   If it is positive still I would recommend treating (8-12 weeks pills) -- I'll have clinic staff contact you for treatment arrangement   We'll give you hepatitis b vaccination (2 shot series -- first shot today and the second shot is at least a month from now and can just schedule nurse visit for that; no need to see me at that time)

## 2024-06-29 NOTE — Addendum Note (Signed)
 Addended by: Alven Alverio M on: 06/29/2024 09:48 AM   Modules accepted: Orders

## 2024-06-29 NOTE — Progress Notes (Addendum)
 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 male referred here for hcv infection  Chart reviewed  Patient ivdu and last use a month ago. 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   ---------------- 06/29/24 id clinic f/u No complaint today Usual state of health Discussed labs from 12/2023 Recommended hep b vaccination and he agrees    Review of Systems: ROS All other ros negative      Past Medical History:  Diagnosis Date   Brain tumor (HCC)    treated as a child    Social History   Tobacco Use   Smoking status: Former    Current packs/day:  0.50    Average packs/day: 0.5 packs/day for 10.5 years (5.3 ttl pk-yrs)    Types: Cigarettes    Start date: 12/28/2013    Passive exposure: Never  Vaping Use   Vaping status: Every Day   Substances: Nicotine , Flavoring  Substance Use Topics   Alcohol use: Not Currently   Drug use: Not Currently    Frequency: 7.0 times per week    Comment: fentanyl and meth last used 30 days ago.    No family history on file.  No Known Allergies  OBJECTIVE: Vitals:   06/29/24 0925  BP: 114/81  Pulse: 72  Temp: 98.7 F (37.1 C)  TempSrc: Oral  SpO2: 97%  Weight: 170 lb (77.1 kg)   Body mass index is 23.06 kg/m.   Physical Exam General/constitutional: no distress; avoiding eye contacts throughout exam HEENT: Normocephalic, PER, Conj Clear, EOMI, Oropharynx clear Neck supple CV: rrr no mrg Lungs: clear to auscultation, normal respiratory effort Abd: Soft, Nontender Ext: no edema Skin: No Rash Neuro: nonfocal MSK: no peripheral joint swelling/tenderness/warmth; back spines nontender  Lab: Lab Results  Component Value Date   WBC 4.8 01/07/2024   HGB 14.1 01/07/2024   HCT 42.0 01/07/2024   MCV 81.2 01/07/2024   PLT 312 01/07/2024   Last metabolic panel Lab Results  Component Value Date   GLUCOSE 93 01/07/2024   NA 139 01/07/2024   K 4.2 01/07/2024   CL 100 01/07/2024  CO2 29 01/07/2024   BUN 13 01/07/2024   CREATININE 0.88 01/07/2024   GFRNONAA >60 11/30/2023   CALCIUM 10.4 (H) 01/07/2024   PROT 8.8 (H) 01/07/2024   ALBUMIN 1.8 (L) 11/30/2023   BILITOT 0.8 01/07/2024   ALKPHOS 178 (H) 11/30/2023   AST 79 (H) 01/07/2024   ALT 155 (H) 01/07/2024   ANIONGAP 7 11/30/2023    Microbiology:  Serology:  Imaging:   Assessment/plan: Problem List Items Addressed This Visit   None Visit Diagnoses       Chronic hepatitis C with hepatic coma (HCC)    -  Primary   Relevant Orders   Hepatitis C RNA quantitative   COMPLETE METABOLIC PANEL WITHOUT GFR   CBC    Hepatitis C genotype     Need for hepatitis B screening test       Relevant Orders   Hepatitis B surface antibody,quantitative          28 yo male hx brain tumor s/p resection 2020 at [facility], ivdu, bipolar, recent 11/27/23 admission for sepsis/abd pain tx for pna, positive hcv ab then referred here for that  #hep c ab positive #ivdu #methadone  replacement Rna unit and positive abx 11/28/23 Hiv screen negative 11/28/23 Hep b screen negative 11/28/23 He said he was tested for hep c earlier last year and was negative He last used iv fentanyl a month ago and is currently on methadone   No rash/joint pain/malaise at this time Discuss possibility of acute hep c infection recently and will repeat labs today and in 6 months before further evaluation -- 25% hcv infection is acute/self limited  Encourage ongoing drug use abstinence  Repeat hep b and hiv testing today   F/u 6 months -- sooner potentially to start hep b vaccination if needed  Other hep c counseling: -discussed natural progression of hep c, transmission (avoid sharing personal hygiene equipment) -discussed avoid toxin like etoh and excessive acetamaminphen (no more than 2 gram a day) -discussed healthy life style and good glucose control -discussed avoiding eating raw sea food -discussed we can treat hep c but can be reinfected -discussed hepatitis coinfection and vaccination   ---------------- 06/29/24 id clinic assessment Patient is here for hep c acute vs chronic infection determination. Last seen 12/2023 at that time lft was rising and hcv rna was a month prior to that visit improving to around 3k. A year prior he reports hep c testing was negative  Hep b testing shows no evidence of prior hep b exposure  Will contact him if hep c still positive and will consider mavyret  8 weeks   ------------ Addendum Hcv rna still high Chronic hep c  Will refer to our pharmacy team to consider 8 week mavyret   treatment course  Fib-4 as of 06/29/24 is 1.14 (no evidence of advance fibrosis)     Follow-up: Return in about 6 weeks (around 08/10/2024). For 2nd shot hep b vaccine  Brad ONEIDA Passer, MD [facility] 06/29/2024, 9:34 AM

## 2024-07-03 ENCOUNTER — Ambulatory Visit: Payer: Self-pay | Admitting: Internal Medicine

## 2024-07-03 ENCOUNTER — Other Ambulatory Visit (HOSPITAL_COMMUNITY): Payer: Self-pay

## 2024-07-03 LAB — HEPATITIS C RNA QUANTITATIVE
HCV Quantitative Log: 6.38 {Log_IU}/mL — ABNORMAL HIGH
HCV RNA, PCR, QN: 2400000 [IU]/mL — ABNORMAL HIGH

## 2024-07-03 LAB — CBC
HCT: 43.9 % (ref 38.5–50.0)
Hemoglobin: 14.6 g/dL (ref 13.2–17.1)
MCH: 28.5 pg (ref 27.0–33.0)
MCHC: 33.3 g/dL (ref 32.0–36.0)
MCV: 85.7 fL (ref 80.0–100.0)
MPV: 9.8 fL (ref 7.5–12.5)
Platelets: 209 Thousand/uL (ref 140–400)
RBC: 5.12 Million/uL (ref 4.20–5.80)
RDW: 13.5 % (ref 11.0–15.0)
WBC: 3.8 Thousand/uL (ref 3.8–10.8)

## 2024-07-03 LAB — COMPLETE METABOLIC PANEL WITHOUT GFR
AG Ratio: 1.6 (calc) (ref 1.0–2.5)
ALT: 70 U/L — ABNORMAL HIGH (ref 9–46)
AST: 71 U/L — ABNORMAL HIGH (ref 10–40)
Albumin: 4.5 g/dL (ref 3.6–5.1)
Alkaline phosphatase (APISO): 132 U/L — ABNORMAL HIGH (ref 36–130)
BUN: 11 mg/dL (ref 7–25)
CO2: 31 mmol/L (ref 20–32)
Calcium: 9.4 mg/dL (ref 8.6–10.3)
Chloride: 103 mmol/L (ref 98–110)
Creat: 0.99 mg/dL (ref 0.60–1.24)
Globulin: 2.9 g/dL (ref 1.9–3.7)
Glucose, Bld: 96 mg/dL (ref 65–99)
Potassium: 4.2 mmol/L (ref 3.5–5.3)
Sodium: 139 mmol/L (ref 135–146)
Total Bilirubin: 0.8 mg/dL (ref 0.2–1.2)
Total Protein: 7.4 g/dL (ref 6.1–8.1)

## 2024-07-03 LAB — HEPATITIS B SURFACE ANTIBODY, QUANTITATIVE: Hep B S AB Quant (Post): <5 m[IU]/mL — ABNORMAL LOW (ref >=10)

## 2024-07-03 LAB — HEPATITIS C GENOTYPE

## 2024-07-04 ENCOUNTER — Other Ambulatory Visit: Payer: Self-pay | Admitting: Pharmacist

## 2024-07-04 ENCOUNTER — Other Ambulatory Visit: Payer: Self-pay

## 2024-07-04 ENCOUNTER — Other Ambulatory Visit (HOSPITAL_COMMUNITY): Payer: Self-pay

## 2024-07-04 DIAGNOSIS — B182 Chronic viral hepatitis C: Secondary | ICD-10-CM

## 2024-07-04 MED ORDER — MAVYRET 100-40 MG PO TABS
3.0000 | ORAL_TABLET | Freq: Every day | ORAL | 1 refills | Status: AC
Start: 2024-07-04 — End: ?
  Filled 2024-07-04: qty 84, 28d supply, fill #0
  Filled 2024-07-27 – 2024-08-01 (×2): qty 84, 28d supply, fill #1

## 2024-07-04 NOTE — Progress Notes (Signed)
 Specialty Pharmacy Initial Fill Coordination Note  Brad Evans is a 28 y.o. male contacted today regarding initial fill of specialty medication(s) Glecaprevir -Pibrentasvir  (Mavyret )   Patient requested Courier to Provider Office   Delivery date: 07/06/24   Verified address: [address] [place] [address]   Medication will be filled on 07/05/24.   Patient is aware of $4.00 copayment.  Put on AR/ACCT

## 2024-07-05 ENCOUNTER — Other Ambulatory Visit: Payer: Self-pay

## 2024-07-05 ENCOUNTER — Other Ambulatory Visit (HOSPITAL_COMMUNITY): Payer: Self-pay

## 2024-07-06 ENCOUNTER — Other Ambulatory Visit: Payer: Self-pay | Admitting: Pharmacist

## 2024-07-06 ENCOUNTER — Telehealth: Payer: Self-pay

## 2024-07-06 ENCOUNTER — Other Ambulatory Visit: Payer: Self-pay

## 2024-07-06 NOTE — Telephone Encounter (Signed)
 [facility] Patient Advocate Encounter  Patient's medications Mavyret  have been couriered to [facility] from [facility] and will be picked up on 07/06/24.  Brad Evans, CPhT Specialty Pharmacy Patient [facility] Phone: [phone] Fax:  [phone]

## 2024-07-06 NOTE — Progress Notes (Signed)
 Specialty Pharmacy Initiation Note   Brad Evans is a 28 y.o. male who will be followed by the specialty pharmacy service for RxSp Hepatitis C    Review of administration, indication, effectiveness, safety, potential side effects, storage/disposable, and missed dose instructions occurred today for patient's specialty medication(s) Glecaprevir -Pibrentasvir  (Mavyret )     Patient/Caregiver did not have any additional questions or concerns.   Patient's therapy is appropriate to: Initiate    Goals Addressed             This Visit's Progress    Achieve virologic cure as evidenced by SVR       Patient is initiating therapy. Patient will be evaluated at upcoming provider appointment to assess progress      Comply with lab assessments       Patient is on track. Patient will adhere to provider and/or lab appointments      Maintain optimal adherence to therapy       Patient is initiating therapy. Patient will be evaluated at upcoming provider appointment to assess progress         Alan JINNY Geralds Specialty Pharmacist

## 2024-07-06 NOTE — Progress Notes (Signed)
 Patient is approved to receive Mavyret  x 8 weeks for chronic Hepatitis C infection. Counseled patient to take all three tablets of Mavyret  daily with food.  Counseled patient the need to take all three tablets together and to not separate them out during the day. Encouraged patient not to miss any doses and explained how their chance of cure could go down with each dose missed. Counseled patient on what to do if dose is missed - if it is closer to the missed dose take immediately; if closer to next dose then skip dose and take the next dose at the usual time. Counseled patient on common side effects such as headache, fatigue, and nausea and that these normally decrease with time. I reviewed patient medications and found no drug interactions. Discussed with patient that there are several drug interactions with Mavyret  and instructed patient to call the clinic if he wishes to start a new medication during course of therapy. Also advised patient to call if he experiences any side effects. Patient will follow-up with me in the pharmacy clinic on 08/03/24.  Alan Geralds, PharmD, CPP, BCIDP, AAHIVP Clinical Pharmacist Practitioner Infectious Diseases Clinical Pharmacist [facility]

## 2024-07-27 ENCOUNTER — Other Ambulatory Visit: Payer: Self-pay

## 2024-07-31 ENCOUNTER — Other Ambulatory Visit: Payer: Self-pay

## 2024-07-31 ENCOUNTER — Ambulatory Visit: Payer: MEDICAID

## 2024-08-01 ENCOUNTER — Other Ambulatory Visit (HOSPITAL_COMMUNITY): Payer: Self-pay

## 2024-08-01 ENCOUNTER — Other Ambulatory Visit: Payer: Self-pay

## 2024-08-01 NOTE — Progress Notes (Signed)
 Specialty Pharmacy Refill Coordination Note  Brad Evans is a 28 y.o. male assessed today regarding refills of clinic administered specialty medication(s) Glecaprevir -Pibrentasvir  (Mavyret )   Clinic requested Courier to Provider Office   Delivery date: 08/03/24   Verified address: [address] [place] [address]   Medication will be filled on 08/02/24.

## 2024-08-02 ENCOUNTER — Other Ambulatory Visit: Payer: Self-pay

## 2024-08-03 ENCOUNTER — Other Ambulatory Visit: Payer: Self-pay

## 2024-08-03 ENCOUNTER — Telehealth: Payer: Self-pay

## 2024-08-03 ENCOUNTER — Ambulatory Visit: Payer: MEDICAID | Admitting: Pharmacist

## 2024-08-03 DIAGNOSIS — Z23 Encounter for immunization: Secondary | ICD-10-CM

## 2024-08-03 DIAGNOSIS — B182 Chronic viral hepatitis C: Secondary | ICD-10-CM

## 2024-08-03 NOTE — Telephone Encounter (Signed)
 [facility] Patient Advocate Encounter  Patient's medications Mavyret  have been couriered to [facility] from [facility] and will be  picked up at the patients appointment on 08/03/24.  2nd Mavyret  box  Arland Hutchinson, CPhT Specialty Pharmacy Patient [facility] Phone: [phone] Fax:  [phone]

## 2024-08-03 NOTE — Progress Notes (Addendum)
   HPI: Brad Evans is a 28 y.o. male who presents to the [facility] pharmacy clinic for Hepatitis C follow-up. Patient reports he is mostly adherent but missed about 7 doses throughout the month. He reports tolerability with minimal nausea from time to time.   Medication: Mavyret   Start Date: 07/06/2024  Hepatitis C Genotype: 1a  Fibrosis Score: 1.14 (Dr. 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   METHADONE  (DOLOPHINE ) 10 MG/ML SOLUTION    Take 115 mg by mouth every 8 (eight) hours. Has taken or 2 months.  Modified Medications   No medications on file  Discontinued Medications   No medications on file    Labs: Hepatitis C Lab Results  Component Value Date   HCVGENOTYPE 1a 06/29/2024   HCVRNAPCRQN 2,400,000 (H) 06/29/2024   HCVRNAPCRQN 2,790 (H) 01/07/2024   Hepatitis B Lab Results  Component Value Date   HEPBSAG NON-REACTIVE 01/07/2024   HEPBCAB NON-REACTIVE 01/07/2024   Hepatitis A No results found for: HAV HIV Lab Results  Component Value Date   HIV NON-REACTIVE  01/07/2024   HIV Non Reactive 11/28/2023   Lab Results  Component Value Date   CREATININE 0.99 06/29/2024   CREATININE 0.88 01/07/2024   CREATININE 0.76 11/30/2023   CREATININE 0.72 11/29/2023   CREATININE 0.80 11/28/2023   Lab Results  Component Value Date   AST 71 (H) 06/29/2024   AST 79 (H) 01/07/2024   AST 52 (H) 11/30/2023   ALT 70 (H) 06/29/2024   ALT 155 (H) 01/07/2024   ALT 57 (H) 11/30/2023   INR 1.2 11/27/2023   INR 0.9 06/04/2021    Assessment: 28 yo M presenting for HepC follow up. Pt is taking Mavyret  for 8 weeks for non-fibrotic HepC. He is on week 4 of therapy. Pt is candidate for HAV and HBV vaccination today. Pt is due for second dose of HAV in 6 months (02/04/24). LFTs have been elevated; will get CMP today to trend. Will follow up on labs.    Plan: -HCV RNA  -first dose of HepA vaccine administered today -second dose of HepB vaccine administered today  -CMP -additional supply of medication provided  Elma Fail, PharmD PGY1 Clinical Pharmacist [facility]  08/03/2024 10:39 AM

## 2024-08-07 ENCOUNTER — Other Ambulatory Visit: Payer: Self-pay

## 2024-08-09 LAB — COMPREHENSIVE METABOLIC PANEL WITH GFR
AG Ratio: 1.6 (calc) (ref 1.0–2.5)
ALT: 16 U/L (ref 9–46)
AST: 19 U/L (ref 10–40)
Albumin: 4.6 g/dL (ref 3.6–5.1)
Alkaline phosphatase (APISO): 139 U/L — ABNORMAL HIGH (ref 36–130)
BUN: 10 mg/dL (ref 7–25)
CO2: 29 mmol/L (ref 20–32)
Calcium: 9.8 mg/dL (ref 8.6–10.3)
Chloride: 99 mmol/L (ref 98–110)
Creat: 1.01 mg/dL (ref 0.60–1.24)
Globulin: 2.9 g/dL (ref 1.9–3.7)
Glucose, Bld: 103 mg/dL — ABNORMAL HIGH (ref 65–99)
Potassium: 4.3 mmol/L (ref 3.5–5.3)
Sodium: 139 mmol/L (ref 135–146)
Total Bilirubin: 0.6 mg/dL (ref 0.2–1.2)
Total Protein: 7.5 g/dL (ref 6.1–8.1)
eGFR: 104 mL/min/1.73m2 (ref >=60)

## 2024-08-09 LAB — HEPATITIS C RNA QUANTITATIVE
HCV Quantitative Log: <1.18 {Log_IU}/mL
HCV RNA, PCR, QN: <15 [IU]/mL

## 2024-08-10 ENCOUNTER — Ambulatory Visit: Payer: MEDICAID | Admitting: Infectious Diseases

## 2024-08-23 ENCOUNTER — Other Ambulatory Visit (HOSPITAL_COMMUNITY): Payer: Self-pay

## 2024-09-06 NOTE — Progress Notes (Unsigned)
   HPI: Brad Evans is a 28 y.o. male who presents to the [facility] pharmacy clinic for Hepatitis C follow-up for end of therapy. *** missed doses? *** side effects?  Medication: Mavyret   Start Date: 07/06/2024  Hepatitis C Genotype: 1a  Fibrosis Score: 1.14 (Dr. 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   METHADONE  (DOLOPHINE ) 10 MG/ML SOLUTION    Take 115 mg by mouth every 8 (eight) hours. Has taken or 2 months.  Modified Medications   No medications on file  Discontinued Medications   No medications on file    Labs: Hepatitis C Lab Results  Component Value Date   HCVGENOTYPE 1a 06/29/2024   HCVRNAPCRQN <15 NOT DETECTED 08/03/2024   HCVRNAPCRQN 2,400,000 (H) 06/29/2024   HCVRNAPCRQN 2,790 (H) 01/07/2024   Hepatitis B Lab Results  Component Value Date   HEPBSAG NON-REACTIVE 01/07/2024   HEPBCAB NON-REACTIVE 01/07/2024   Hepatitis A No results found for: HAV HIV Lab Results  Component Value Date   HIV NON-REACTIVE 01/07/2024   HIV Non Reactive 11/28/2023   Lab  Results  Component Value Date   CREATININE 1.01 08/03/2024   CREATININE 0.99 06/29/2024   CREATININE 0.88 01/07/2024   CREATININE 0.76 11/30/2023   CREATININE 0.72 11/29/2023   Lab Results  Component Value Date   AST 19 08/03/2024   AST 71 (H) 06/29/2024   AST 79 (H) 01/07/2024   ALT 16 08/03/2024   ALT 70 (H) 06/29/2024   ALT 155 (H) 01/07/2024   INR 1.2 11/27/2023   INR 0.9 06/04/2021    Assessment: Kelon is presenting for HepC follow up, EOT. He is taking Mavyret  for 8 weeks for non-fibrotic HepC, completed therapy. *** HepC RNA collected on 08/03/24, non-detectable. Will order updated labs for end of therapy. LFTs taken on the same date were slightly elevated, will reassess today to ensure downward trend. *** symptoms of liver tox?  Previously received 1/2 HAV and 2/3 HBV vaccinations on 08/03/24. Due for second dose of HAV in Feburary and HBV in January. *** Due for influenza immunization today ***  *** Needs scheduled f/u with Dr. Overton ~6 months after 06/2024 visit (~Jan 2026)   Plan: Administered influenza?? *** HCV RNA and CMP today Due for immunizations in Jan (HBV) and Feb (HAV) of 2026 Next scheduled visit with Dr. Overton on *** Call with any issues or questions   Woodie Jock, PharmD PGY1 Pharmacy Resident

## 2024-09-07 ENCOUNTER — Other Ambulatory Visit: Payer: Self-pay

## 2024-09-07 ENCOUNTER — Ambulatory Visit (INDEPENDENT_AMBULATORY_CARE_PROVIDER_SITE_OTHER): Payer: MEDICAID | Admitting: Pharmacist

## 2024-09-07 DIAGNOSIS — B182 Chronic viral hepatitis C: Secondary | ICD-10-CM | POA: Diagnosis not present

## 2024-09-07 DIAGNOSIS — Z23 Encounter for immunization: Secondary | ICD-10-CM

## 2024-09-09 LAB — HEPATITIS C RNA QUANTITATIVE
HCV Quantitative Log: <1.18 {Log_IU}/mL
HCV RNA, PCR, QN: <15 [IU]/mL

## 2024-09-09 LAB — HEPATITIS B SURFACE ANTIBODY,QUALITATIVE: Hep B S Ab: REACTIVE — AB

## 2024-09-20 ENCOUNTER — Other Ambulatory Visit: Payer: Self-pay

## 2024-09-22 ENCOUNTER — Other Ambulatory Visit: Payer: Self-pay

## 2024-10-11 NOTE — Progress Notes (Deleted)
   [facility] Vaccination Visit  HPI: Brad Evans is a 28 y.o. male who presents to the [facility] pharmacy clinic for vaccine administration.  Referring ID Physician: Dr. Overton  Hepatitis B Lab Results  Component Value Date   HEPBSAB REACTIVE (A) 09/07/2024   Lab Results  Component Value Date   HEPBSAG NON-REACTIVE 01/07/2024    Hepatitis C Lab Results  Component Value Date   HCVAB Reactive (A) 11/28/2023    Hepatitis A No results found for: HAV  Assessment & Plan: - Administered 2/3 HPV vaccine - Patient tolerated well - Next vaccine scheduled for ~3-4 months  - SVR visit with Dr. Overton on 01/04/25  Alan Geralds, PharmD, CPP, BCIDP, AAHIVP Clinical Pharmacist Practitioner Infectious Diseases Clinical Pharmacist [facility] 10/11/2024, 2:18 PM

## 2024-10-12 ENCOUNTER — Ambulatory Visit: Payer: MEDICAID | Admitting: Pharmacist

## 2024-10-12 DIAGNOSIS — Z23 Encounter for immunization: Secondary | ICD-10-CM

## 2024-10-18 ENCOUNTER — Other Ambulatory Visit: Payer: Self-pay

## 2024-10-18 ENCOUNTER — Ambulatory Visit: Payer: MEDICAID | Admitting: Pharmacist

## 2024-10-18 DIAGNOSIS — Z23 Encounter for immunization: Secondary | ICD-10-CM

## 2024-10-18 NOTE — Progress Notes (Signed)
   [facility] Pharm[facility] Vaccination Visit  HPI: Brad Evans is a 28 y.o. male who presents to the [facility] pharmacy clinic for vaccine administration.  Referring ID Physician: Dr. Overton  Hepatitis B Lab Results  Component Value Date   HEPBSAB REACTIVE (A) 09/07/2024   Lab Results  Component Value Date   HEPBSAG NON-REACTIVE 01/07/2024    Hepatitis C Lab Results  Component Value Date   HCVAB Reactive (A) 11/28/2023    Hepatitis A No results found for: HAV  Assessment & Plan: - Administered 2/3 HPV vaccine - Patient tolerated well - Next vaccine scheduled for ~3-4 months from now  - Follow up with Dr. Overton on 01/04/25 for HCV SVR appointment   Alan Geralds, PharmD, CPP, BCIDP, AAHIVP Clinical Pharmacist Practitioner Infectious Diseases Clinical Pharmacist [facility] 10/18/2024, 3:30 PM

## 2024-12-08 ENCOUNTER — Other Ambulatory Visit: Payer: Self-pay | Admitting: Pharmacist

## 2024-12-08 NOTE — Progress Notes (Signed)
 Specialty Pharmacy Ongoing Clinical Assessment Note  Brad Evans is a 28 y.o. male who is being followed by the specialty pharmacy service for RxSp Hepatitis C   Patient's specialty medication(s) reviewed today: Glecaprevir -Pibrentasvir  (Mavyret )   Missed doses in the last 4 weeks: 0   Patient/Caregiver did not have any additional questions or concerns.   Therapeutic benefit summary: Patient is achieving benefit   Adverse events/side effects summary: No adverse events/side effects   Patient's therapy is appropriate to: Discontinue    Goals Addressed             This Visit's Progress    Achieve virologic cure as evidenced by SVR   On track    Patient is initiating therapy. Patient will be evaluated at upcoming provider appointment to assess progress      Comply with lab assessments   On track    Patient is on track. Patient will adhere to provider and/or lab appointments      Maintain optimal adherence to therapy   On track    Patient is initiating therapy. Patient will be evaluated at upcoming provider appointment to assess progress         Follow up: none required - therapy completed  Alan JINNY Geralds Specialty Pharmacist

## 2025-01-04 ENCOUNTER — Ambulatory Visit: Payer: MEDICAID | Admitting: Internal Medicine

## 2025-01-23 ENCOUNTER — Ambulatory Visit: Payer: MEDICAID | Admitting: Internal Medicine

## 2025-01-30 ENCOUNTER — Ambulatory Visit (INDEPENDENT_AMBULATORY_CARE_PROVIDER_SITE_OTHER): Payer: Self-pay | Admitting: Internal Medicine

## 2025-01-30 ENCOUNTER — Encounter: Payer: Self-pay | Admitting: Internal Medicine

## 2025-01-30 ENCOUNTER — Other Ambulatory Visit: Payer: Self-pay

## 2025-01-30 VITALS — BP 131/77 | HR 93 | Temp 98.4°F | Ht 72.0 in | Wt 183.0 lb

## 2025-01-30 DIAGNOSIS — B182 Chronic viral hepatitis C: Secondary | ICD-10-CM

## 2025-01-30 NOTE — Addendum Note (Signed)
 Addended byBETHA OVERTON FAITH T on: 01/30/2025 02:47 PM   Modules accepted: Orders

## 2025-02-02 LAB — HEPATITIS C RNA QUANTITATIVE
HCV Quantitative Log: <1.18 {Log_IU}/mL
HCV RNA, PCR, QN: <15 [IU]/mL
